# Patient Record
Sex: Female | Born: 1935 | Race: White | Hispanic: No | Smoking: Never smoker
Health system: Southern US, Community
[De-identification: ages and names within clinical notes are randomized; demographics above are authoritative.]

## PROBLEM LIST (undated history)

## (undated) DIAGNOSIS — F32A Depression, unspecified: Secondary | ICD-10-CM

## (undated) DIAGNOSIS — K219 Gastro-esophageal reflux disease without esophagitis: Secondary | ICD-10-CM

## (undated) DIAGNOSIS — I82409 Acute embolism and thrombosis of unspecified deep veins of unspecified lower extremity: Secondary | ICD-10-CM

## (undated) DIAGNOSIS — N390 Urinary tract infection, site not specified: Secondary | ICD-10-CM

## (undated) DIAGNOSIS — F329 Major depressive disorder, single episode, unspecified: Secondary | ICD-10-CM

## (undated) DIAGNOSIS — Z9289 Personal history of other medical treatment: Secondary | ICD-10-CM

## (undated) DIAGNOSIS — K5792 Diverticulitis of intestine, part unspecified, without perforation or abscess without bleeding: Secondary | ICD-10-CM

## (undated) DIAGNOSIS — M1990 Unspecified osteoarthritis, unspecified site: Secondary | ICD-10-CM

## (undated) DIAGNOSIS — E119 Type 2 diabetes mellitus without complications: Secondary | ICD-10-CM

## (undated) DIAGNOSIS — D649 Anemia, unspecified: Secondary | ICD-10-CM

## (undated) HISTORY — PX: TONSILECTOMY, ADENOIDECTOMY, BILATERAL MYRINGOTOMY AND TUBES: SHX2538

## (undated) HISTORY — PX: ANKLE FRACTURE SURGERY: SHX122

## (undated) HISTORY — PX: FRACTURE SURGERY: SHX138

## (undated) HISTORY — PX: CHOLECYSTECTOMY OPEN: SUR202

## (undated) HISTORY — PX: APPENDECTOMY: SHX54

## (undated) HISTORY — PX: TONSILLECTOMY: SUR1361

## (undated) HISTORY — PX: CYST EXCISION: SHX5701

## (undated) HISTORY — PX: CATARACT EXTRACTION W/ INTRAOCULAR LENS  IMPLANT, BILATERAL: SHX1307

---

## 1997-11-20 ENCOUNTER — Other Ambulatory Visit: Admission: RE | Admit: 1997-11-20 | Discharge: 1997-11-20 | Payer: Self-pay | Admitting: Internal Medicine

## 1998-02-17 ENCOUNTER — Emergency Department (HOSPITAL_COMMUNITY): Admission: EM | Admit: 1998-02-17 | Discharge: 1998-02-17 | Payer: Self-pay | Admitting: Emergency Medicine

## 1998-02-17 ENCOUNTER — Encounter: Payer: Self-pay | Admitting: Emergency Medicine

## 1998-11-26 ENCOUNTER — Other Ambulatory Visit: Admission: RE | Admit: 1998-11-26 | Discharge: 1998-11-26 | Payer: Self-pay | Admitting: Internal Medicine

## 1999-12-23 ENCOUNTER — Other Ambulatory Visit: Admission: RE | Admit: 1999-12-23 | Discharge: 1999-12-23 | Payer: Self-pay | Admitting: Internal Medicine

## 2000-09-16 ENCOUNTER — Ambulatory Visit (HOSPITAL_COMMUNITY): Admission: RE | Admit: 2000-09-16 | Discharge: 2000-09-17 | Payer: Self-pay | Admitting: Surgery

## 2000-09-16 ENCOUNTER — Encounter (INDEPENDENT_AMBULATORY_CARE_PROVIDER_SITE_OTHER): Payer: Self-pay | Admitting: *Deleted

## 2000-11-10 ENCOUNTER — Ambulatory Visit (HOSPITAL_COMMUNITY): Admission: RE | Admit: 2000-11-10 | Discharge: 2000-11-10 | Payer: Self-pay | Admitting: Specialist

## 2001-01-24 ENCOUNTER — Ambulatory Visit (HOSPITAL_COMMUNITY): Admission: RE | Admit: 2001-01-24 | Discharge: 2001-01-24 | Payer: Self-pay | Admitting: Specialist

## 2001-05-12 ENCOUNTER — Encounter (INDEPENDENT_AMBULATORY_CARE_PROVIDER_SITE_OTHER): Payer: Self-pay | Admitting: Specialist

## 2001-05-12 ENCOUNTER — Ambulatory Visit (HOSPITAL_COMMUNITY): Admission: RE | Admit: 2001-05-12 | Discharge: 2001-05-12 | Payer: Self-pay | Admitting: Gastroenterology

## 2008-03-22 ENCOUNTER — Ambulatory Visit (HOSPITAL_COMMUNITY): Admission: RE | Admit: 2008-03-22 | Discharge: 2008-03-22 | Payer: Self-pay | Admitting: Internal Medicine

## 2008-07-18 ENCOUNTER — Ambulatory Visit (HOSPITAL_BASED_OUTPATIENT_CLINIC_OR_DEPARTMENT_OTHER): Admission: RE | Admit: 2008-07-18 | Discharge: 2008-07-18 | Payer: Self-pay | Admitting: Internal Medicine

## 2008-07-18 ENCOUNTER — Ambulatory Visit: Payer: Self-pay | Admitting: Diagnostic Radiology

## 2009-07-23 ENCOUNTER — Ambulatory Visit (HOSPITAL_BASED_OUTPATIENT_CLINIC_OR_DEPARTMENT_OTHER): Admission: RE | Admit: 2009-07-23 | Discharge: 2009-07-23 | Payer: Self-pay | Admitting: Internal Medicine

## 2009-07-23 ENCOUNTER — Ambulatory Visit: Payer: Self-pay | Admitting: Diagnostic Radiology

## 2010-05-23 NOTE — Procedures (Signed)
[facility]  Patient:    [name], [name] Visit Number: [id number] MRN: [id number]          Service Type: END Location: ENDO Attending Physician:  Nelda Marseille Dictated by:   Petra Kuba, M.D. Proc. Date: 05/12/01 Admit Date:  05/12/2001   CC:         Richard A. Jacky Kindle, M.D.   Procedure Report  PROCEDURE:  Colonoscopy with polypectomy.  INDICATION:  Anemia in a patient due for colonic screening. Consent was signed after risk, benefits, methods, and options were thoroughly discussed in the office.  MEDICINES USED:  Demerol 50 mg, Versed 6 mg.  DESCRIPTION OF PROCEDURE:  Rectal inspection was pertinent for external hemorrhoids. Digital exam was negative. Video pediatric adjustable colonoscope was inserted and with lots of difficulty due to a diverticula-filled sigmoid with some tortuosity, we were able to be advanced through this area and then fairly easily advanced to the cecum. This did not require any abdominal pressure or any position changes. On insertion, left greater than right diverticula were seen, possibly a small polyp was seen in the sigmoid, amid diverticula but no other abnormalities. The cecum was identified by the appendiceal orifice and the ileocecal valve. _______ the scope was inserted shortways in the terminal ileum, which was normal. Photo documentation was obtained and the scope was slowly withdrawn. The prep was adequate. There was some liquid stool that required washing and suctioning in the cecal pole, a questionable small sessile polyp was seen and was cold biopsied x 2. The scope was further withdrawn and at the proximal level of the hepatic flexure, a small polyp was seen, snare electrocautery applied, and the polyp was suctioned through the scope and collected in the trap. Two additional descending and sigmoid polyps were seen and were each hot biopsied x one or two on withdrawal. We are not sure if the last one  in the sigmoid was the exact same as the one seen on insertion, but no other polyps were seen. On slow withdrawal back to the rectum, other than the left greater than right diverticula mentioned above, no additional findings were seen. Once back in the rectum, the scope was then retroflexed; pertinent for some internal hemorrhoids. The scope was straightened and readvanced shortways up the left side of the colon. Air was suctioned and scope removed. The patient tolerated the procedure well. There was no obvious immediate complication.  ENDOSCOPIC DIAGNOSES: 1. Internal/external hemorrhoids. 2. Significant left greater than right diverticula and tortuosity. 3. Three tiny to small polyps were in the transverse/hepatic flexures, snared;    two others in the descending, hot biopsied. Questionably, one other small    one seen on insertion in the sigmoid and not seen on withdrawal. Hard    to know if that was the same one that hot biopsied. 4. Small sessile questionable polyp in the cecum, cold biopsied. 5. Otherwise within normal limits to the terminal ileum.  PLAN:  Await pathology to determine future colonic screening. Otherwise, continue workup with an EGD and see that dictation for further workup and plans. Dictated by:   Petra Kuba, M.D. Attending Physician:  Nelda Marseille DD:  05/12/01 TD:  05/14/01 Job: [id number]

## 2010-05-23 NOTE — Op Note (Signed)
[facility]  Patient:    [name], DUTAN Visit Number: [id number] MRN: [id number]          Service Type: DSU Location: RCRM 2550 04 Attending Physician:  Bonnetta Barry Dictated by:   Velora Heckler, M.D. Proc. Date: 09/16/00 Admit Date:  09/16/2000   CC:         Richard A. Jacky Kindle, M.D.   Operative Report  PREOPERATIVE DIAGNOSIS:  Symptomatic cholelithiasis, chronic cholecystitis.  POSTOPERATIVE DIAGNOSIS:  Symptomatic cholelithiasis, chronic cholecystitis.  OPERATION:  Laparoscopic cholecystectomy.  SURGEON:  Velora Heckler, M.D.  ASSISTANT:  Sandria Bales. Ezzard Standing, M.D.  ANESTHESIA:  General  ESTIMATED BLOOD LOSS:  Minimal  PREPARATION:  Betadine.  COMPLICATIONS:  None  INDICATIONS:  The patient is a 77 year old white female who presents at the request of Dr. Geoffry Paradise with intermittent abdominal pain.  The patient had undergone ultrasound at [facility] demonstrating multiple gallstones.  Liver function tests were normal.  The patient now comes to surgery for cholecystectomy.  DESCRIPTION OF PROCEDURE:  Procedure is done in OR #17 at the [facility].  The patient is brought to the operating room and placed in a supine position on the operating room table.  Following administration of general anesthesia, the patient was prepped and draped in the usual strict aseptic fashion. After ascertaining that an adequate level of anesthesia had been obtained, an infraumbilical incision was made with a #15 blade. Dissection was carried down through the subcutaneous tissues.  Fascia was incised in the midline and the peritoneal cavity was entered cautiously.  An 0 Vicryl pursestring suture was placed in the fascia.  A Hasson cannula was introduced under direct vision and secured with a pursestring suture.  Abdomen was insufflated with carbon dioxide.  Laparoscopic was introduced under direct vision and the  abdomen explored.  Operative ports were placed along the right costal margin in the midline, midclavicular line, and the anterior axillary line.  The fundus of the gallbladder was grasped and retracted cephalad. There are numerous omental adhesions to the undersurface of the gallbladder and liver.  These are taken down with blunt dissection and hemostasis obtained with the electrocautery.  Dissection is begun at the neck of the gallbladder. The common bile is identified.  The cystic duct is dissected out along its length, triply clipped, and divided.  Cystic artery is dissected out along its length, doubly clipped, and divided.  Small lymphatic vessels to the undersurface of the gallbladder are divided between ligaclips. The gallbladder is excised from the gallbladder bed using a spatula electrocautery for hemostasis.  Gallbladder is extracted through the umbilical port without difficulty.  It contains multiple gallstones.  O Vicryl pursestring suture is tied securely.  Right upper quadrant is copiously irrigated with warm saline and good hemostasis is noted.  Fluid is evacuated.  Ports are removed under direct vision and pneumoperitoneum released.  Operative sites are anesthetized with local anesthetic. All four wounds are closed with interrupted 4-0 Vicryl subcuticular sutures.  Wounds are washed and dried and Benzoin Steri-Strips are applied.  Sterile gauze dressings are applied.  The patient is awakened from anesthesia and brought to the recovery room in stable condition.  The patient tolerated the procedure well. Dictated by:   Velora Heckler, M.D. Attending Physician:  Bonnetta Barry DD:  09/16/00 TD:  09/16/00 Job: [id number]

## 2010-05-23 NOTE — Procedures (Signed)
[facility]  Patient:    [name], [name] Visit Number: [id number] MRN: [id number]          Service Type: END Location: ENDO Attending Physician:  Nelda Marseille Dictated by:   Petra Kuba, M.D. Proc. Date: 05/12/01 Admit Date:  05/12/2001   CC:         Richard A. Jacky Kindle, M.D.   Procedure Report  PROCEDURE:  Esophagogastroduodenoscopy with biopsy.  INDICATIONS FOR PROCEDURE:  Anemia, nondiagnostic colonoscopy.  Consent was signed prior to any premeds after risks, benefits, methods, and options were thoroughly discussed in the office.  MEDICINES USED:  Demerol 10, Versed 1 since this procedure followed the endoscopy.  DESCRIPTION OF PROCEDURE:  The video endoscope was inserted by direct vision. Her proximal mid esophagus was normal. Beginning at about 30 cm was a large hiatal hernia. There was a widely patent small fibrous ring just above it. The scope passed into the stomach and easily advanced through a normal antrum, normal pylorus into a normal duodenal bulb and around the C loop to a normal second portion of the duodenum. A few distal duodenal biopsies were obtained. The scope was withdrawn back to the bulb and a good look there ruled out ulcers in that location. The scope was withdrawn back to the stomach and retroflexed. The angularis and fundus were normal. High in the cardia, the hiatal hernia was confirmed. The lesser and greater curve were normal on retroflexed visualization. Straight visualization of the stomach was normal except for the hiatal hernia. Air was suctioned, scope withdrawn. Again a look at the esophagus was normal. The scope was removed. The patient tolerated the procedure well. There was no obvious or immediate complication.  ENDOSCOPIC DIAGNOSIS:  1. Large hiatal hernia with a widely patent thin fibrous ring.  2. Otherwise within normal limits esophagogastroduodenoscopy status post     duodenal biopsy to rule  out malabsorption.  PLAN:  Will add iron, follow-up p.r.n. or in six weeks to recheck guaiac, CBC and decide any further workup and plans like possibly a one time small bowel follow-through. Dictated by:   Petra Kuba, M.D. Attending Physician:  Nelda Marseille DD:  05/12/01 TD:  05/14/01 Job: [id number]

## 2010-06-23 ENCOUNTER — Other Ambulatory Visit (HOSPITAL_BASED_OUTPATIENT_CLINIC_OR_DEPARTMENT_OTHER): Payer: Self-pay | Admitting: Internal Medicine

## 2010-06-23 DIAGNOSIS — Z1231 Encounter for screening mammogram for malignant neoplasm of breast: Secondary | ICD-10-CM

## 2010-07-29 ENCOUNTER — Ambulatory Visit (HOSPITAL_BASED_OUTPATIENT_CLINIC_OR_DEPARTMENT_OTHER)
Admission: RE | Admit: 2010-07-29 | Discharge: 2010-07-29 | Disposition: A | Discharge status: Home / Self Care | Payer: Medicare Other | Source: Ambulatory Visit | Attending: Internal Medicine | Admitting: Internal Medicine

## 2010-07-29 DIAGNOSIS — Z1231 Encounter for screening mammogram for malignant neoplasm of breast: Secondary | ICD-10-CM | POA: Insufficient documentation

## 2011-01-16 ENCOUNTER — Other Ambulatory Visit (HOSPITAL_COMMUNITY): Payer: Self-pay | Admitting: *Deleted

## 2011-01-20 ENCOUNTER — Inpatient Hospital Stay (HOSPITAL_COMMUNITY): Admission: RE | Admit: 2011-01-20 | Payer: Medicare Other | Source: Ambulatory Visit

## 2011-01-27 ENCOUNTER — Encounter (HOSPITAL_COMMUNITY): Payer: Self-pay

## 2011-01-27 ENCOUNTER — Encounter (HOSPITAL_COMMUNITY)
Admission: RE | Admit: 2011-01-27 | Discharge: 2011-01-27 | Disposition: A | Discharge status: Home / Self Care | Payer: Medicare Other | Source: Ambulatory Visit | Attending: Internal Medicine | Admitting: Internal Medicine

## 2011-01-27 DIAGNOSIS — D649 Anemia, unspecified: Secondary | ICD-10-CM | POA: Insufficient documentation

## 2011-01-27 HISTORY — DX: Anemia, unspecified: D64.9

## 2011-01-27 HISTORY — DX: Gastro-esophageal reflux disease without esophagitis: K21.9

## 2011-01-27 MED ORDER — SODIUM CHLORIDE 0.9 % IV SOLN
INTRAVENOUS | Status: DC
  Administered 2011-01-27: 15:00:00 via INTRAVENOUS

## 2011-01-27 MED ORDER — FERUMOXYTOL INJECTION 510 MG/17 ML
510.0000 mg | INTRAVENOUS | Status: DC
  Administered 2011-01-27: 510 mg via INTRAVENOUS
  Filled 2011-01-27: qty 17

## 2011-02-04 ENCOUNTER — Encounter (HOSPITAL_COMMUNITY): Payer: Self-pay

## 2011-02-04 ENCOUNTER — Encounter (HOSPITAL_COMMUNITY)
Admission: RE | Admit: 2011-02-04 | Discharge: 2011-02-04 | Disposition: A | Discharge status: Home / Self Care | Payer: Medicare Other | Source: Ambulatory Visit | Attending: Internal Medicine | Admitting: Internal Medicine

## 2011-02-04 MED ORDER — SODIUM CHLORIDE 0.9 % IV SOLN
INTRAVENOUS | Status: AC
  Administered 2011-02-04: 15:00:00 via INTRAVENOUS

## 2011-02-04 MED ORDER — FERUMOXYTOL INJECTION 510 MG/17 ML
510.0000 mg | INTRAVENOUS | Status: AC
  Administered 2011-02-04: 510 mg via INTRAVENOUS
  Filled 2011-02-04: qty 17

## 2011-06-25 ENCOUNTER — Other Ambulatory Visit (HOSPITAL_BASED_OUTPATIENT_CLINIC_OR_DEPARTMENT_OTHER): Payer: Self-pay | Admitting: Internal Medicine

## 2011-06-25 DIAGNOSIS — Z1231 Encounter for screening mammogram for malignant neoplasm of breast: Secondary | ICD-10-CM

## 2011-09-02 ENCOUNTER — Ambulatory Visit (HOSPITAL_BASED_OUTPATIENT_CLINIC_OR_DEPARTMENT_OTHER): Payer: Medicare Other

## 2011-09-03 ENCOUNTER — Other Ambulatory Visit: Payer: Self-pay | Admitting: Internal Medicine

## 2011-09-03 ENCOUNTER — Ambulatory Visit (HOSPITAL_BASED_OUTPATIENT_CLINIC_OR_DEPARTMENT_OTHER)
Admission: RE | Admit: 2011-09-03 | Discharge: 2011-09-03 | Disposition: A | Discharge status: Home / Self Care | Payer: Medicare Other | Source: Ambulatory Visit | Attending: Internal Medicine | Admitting: Internal Medicine

## 2011-09-03 DIAGNOSIS — Z1231 Encounter for screening mammogram for malignant neoplasm of breast: Secondary | ICD-10-CM

## 2012-07-29 ENCOUNTER — Other Ambulatory Visit (HOSPITAL_BASED_OUTPATIENT_CLINIC_OR_DEPARTMENT_OTHER): Payer: Self-pay | Admitting: Internal Medicine

## 2012-07-29 DIAGNOSIS — Z1231 Encounter for screening mammogram for malignant neoplasm of breast: Secondary | ICD-10-CM

## 2012-09-07 ENCOUNTER — Ambulatory Visit (HOSPITAL_BASED_OUTPATIENT_CLINIC_OR_DEPARTMENT_OTHER)
Admission: RE | Admit: 2012-09-07 | Discharge: 2012-09-07 | Disposition: A | Discharge status: Home / Self Care | Payer: Medicare Other | Source: Ambulatory Visit | Attending: Internal Medicine | Admitting: Internal Medicine

## 2012-09-07 DIAGNOSIS — Z1231 Encounter for screening mammogram for malignant neoplasm of breast: Secondary | ICD-10-CM | POA: Insufficient documentation

## 2012-12-19 ENCOUNTER — Inpatient Hospital Stay (HOSPITAL_COMMUNITY): Admission: RE | Admit: 2012-12-19 | Payer: Medicare Other | Source: Ambulatory Visit

## 2012-12-19 ENCOUNTER — Other Ambulatory Visit (HOSPITAL_COMMUNITY): Payer: Self-pay | Admitting: Internal Medicine

## 2013-01-13 ENCOUNTER — Encounter (HOSPITAL_COMMUNITY)
Admission: RE | Admit: 2013-01-13 | Discharge: 2013-01-13 | Disposition: A | Discharge status: Home / Self Care | Payer: Medicare Other | Source: Ambulatory Visit | Attending: Internal Medicine | Admitting: Internal Medicine

## 2013-01-13 ENCOUNTER — Encounter (HOSPITAL_COMMUNITY): Payer: Self-pay

## 2013-01-13 DIAGNOSIS — D649 Anemia, unspecified: Secondary | ICD-10-CM | POA: Insufficient documentation

## 2013-01-13 MED ORDER — SODIUM CHLORIDE 0.9 % IV SOLN
Freq: Once | INTRAVENOUS | Status: AC
  Administered 2013-01-13: 15:00:00 via INTRAVENOUS

## 2013-01-13 MED ORDER — SODIUM CHLORIDE 0.9 % IV SOLN
1020.0000 mg | Freq: Once | INTRAVENOUS | Status: AC
  Administered 2013-01-13: 1020 mg via INTRAVENOUS
  Filled 2013-01-13: qty 34

## 2013-01-13 NOTE — Discharge Instructions (Signed)

## 2013-08-17 ENCOUNTER — Other Ambulatory Visit (HOSPITAL_BASED_OUTPATIENT_CLINIC_OR_DEPARTMENT_OTHER): Payer: Self-pay | Admitting: Internal Medicine

## 2013-08-17 DIAGNOSIS — Z1231 Encounter for screening mammogram for malignant neoplasm of breast: Secondary | ICD-10-CM

## 2013-10-06 ENCOUNTER — Ambulatory Visit (HOSPITAL_BASED_OUTPATIENT_CLINIC_OR_DEPARTMENT_OTHER)
Admission: RE | Admit: 2013-10-06 | Discharge: 2013-10-06 | Disposition: A | Discharge status: Home / Self Care | Payer: Medicare Other | Source: Ambulatory Visit | Attending: Internal Medicine | Admitting: Internal Medicine

## 2013-10-06 DIAGNOSIS — Z1231 Encounter for screening mammogram for malignant neoplasm of breast: Secondary | ICD-10-CM | POA: Diagnosis present

## 2014-09-12 ENCOUNTER — Other Ambulatory Visit (HOSPITAL_BASED_OUTPATIENT_CLINIC_OR_DEPARTMENT_OTHER): Payer: Self-pay | Admitting: Internal Medicine

## 2014-09-12 DIAGNOSIS — Z1231 Encounter for screening mammogram for malignant neoplasm of breast: Secondary | ICD-10-CM

## 2014-10-09 ENCOUNTER — Ambulatory Visit (HOSPITAL_BASED_OUTPATIENT_CLINIC_OR_DEPARTMENT_OTHER): Payer: Federal, State, Local not specified - PPO

## 2014-10-12 ENCOUNTER — Ambulatory Visit (HOSPITAL_BASED_OUTPATIENT_CLINIC_OR_DEPARTMENT_OTHER)
Admission: RE | Admit: 2014-10-12 | Discharge: 2014-10-12 | Disposition: A | Discharge status: Home / Self Care | Payer: Medicare Other | Source: Ambulatory Visit | Attending: Internal Medicine | Admitting: Internal Medicine

## 2014-10-12 DIAGNOSIS — Z1231 Encounter for screening mammogram for malignant neoplasm of breast: Secondary | ICD-10-CM | POA: Diagnosis not present

## 2014-12-19 ENCOUNTER — Other Ambulatory Visit (HOSPITAL_COMMUNITY): Payer: Self-pay | Admitting: *Deleted

## 2014-12-20 ENCOUNTER — Ambulatory Visit (HOSPITAL_COMMUNITY)
Admission: RE | Admit: 2014-12-20 | Discharge: 2014-12-20 | Disposition: A | Discharge status: Home / Self Care | Payer: Medicare Other | Source: Ambulatory Visit | Attending: Internal Medicine | Admitting: Internal Medicine

## 2014-12-20 DIAGNOSIS — D649 Anemia, unspecified: Secondary | ICD-10-CM | POA: Diagnosis present

## 2014-12-20 MED ORDER — FERUMOXYTOL INJECTION 510 MG/17 ML
510.0000 mg | INTRAVENOUS | Status: DC
  Administered 2014-12-20: 510 mg via INTRAVENOUS
  Filled 2014-12-20: qty 17

## 2014-12-25 ENCOUNTER — Other Ambulatory Visit (HOSPITAL_COMMUNITY): Payer: Self-pay | Admitting: *Deleted

## 2014-12-26 ENCOUNTER — Ambulatory Visit (HOSPITAL_COMMUNITY)
Admission: RE | Admit: 2014-12-26 | Discharge: 2014-12-26 | Disposition: A | Discharge status: Home / Self Care | Payer: Medicare Other | Source: Ambulatory Visit | Attending: Internal Medicine | Admitting: Internal Medicine

## 2014-12-26 DIAGNOSIS — D649 Anemia, unspecified: Secondary | ICD-10-CM | POA: Diagnosis present

## 2014-12-26 MED ORDER — SODIUM CHLORIDE 0.9 % IV SOLN
510.0000 mg | INTRAVENOUS | Status: DC
  Administered 2014-12-26: 510 mg via INTRAVENOUS
  Filled 2014-12-26: qty 17

## 2014-12-27 DIAGNOSIS — D649 Anemia, unspecified: Secondary | ICD-10-CM | POA: Diagnosis not present

## 2015-05-21 ENCOUNTER — Emergency Department (HOSPITAL_COMMUNITY): Payer: Medicare Other

## 2015-05-21 ENCOUNTER — Encounter (HOSPITAL_COMMUNITY): Payer: Self-pay | Admitting: *Deleted

## 2015-05-21 ENCOUNTER — Inpatient Hospital Stay (HOSPITAL_COMMUNITY)
Admission: EM | Admit: 2015-05-21 | Discharge: 2015-05-24 | DRG: 872 | Disposition: A | Discharge status: Home / Self Care | Payer: Medicare Other | Attending: Internal Medicine | Admitting: Internal Medicine

## 2015-05-21 DIAGNOSIS — A419 Sepsis, unspecified organism: Secondary | ICD-10-CM

## 2015-05-21 DIAGNOSIS — A4151 Sepsis due to Escherichia coli [E. coli]: Principal | ICD-10-CM | POA: Diagnosis present

## 2015-05-21 DIAGNOSIS — R0902 Hypoxemia: Secondary | ICD-10-CM | POA: Diagnosis present

## 2015-05-21 DIAGNOSIS — R0689 Other abnormalities of breathing: Secondary | ICD-10-CM | POA: Diagnosis present

## 2015-05-21 DIAGNOSIS — Z7984 Long term (current) use of oral hypoglycemic drugs: Secondary | ICD-10-CM | POA: Diagnosis not present

## 2015-05-21 DIAGNOSIS — Z7982 Long term (current) use of aspirin: Secondary | ICD-10-CM

## 2015-05-21 DIAGNOSIS — K219 Gastro-esophageal reflux disease without esophagitis: Secondary | ICD-10-CM | POA: Diagnosis present

## 2015-05-21 DIAGNOSIS — Z79899 Other long term (current) drug therapy: Secondary | ICD-10-CM

## 2015-05-21 DIAGNOSIS — I10 Essential (primary) hypertension: Secondary | ICD-10-CM | POA: Diagnosis present

## 2015-05-21 DIAGNOSIS — E876 Hypokalemia: Secondary | ICD-10-CM | POA: Diagnosis present

## 2015-05-21 DIAGNOSIS — E1165 Type 2 diabetes mellitus with hyperglycemia: Secondary | ICD-10-CM | POA: Diagnosis present

## 2015-05-21 DIAGNOSIS — R6510 Systemic inflammatory response syndrome (SIRS) of non-infectious origin without acute organ dysfunction: Secondary | ICD-10-CM | POA: Diagnosis present

## 2015-05-21 DIAGNOSIS — D509 Iron deficiency anemia, unspecified: Secondary | ICD-10-CM | POA: Diagnosis present

## 2015-05-21 DIAGNOSIS — N390 Urinary tract infection, site not specified: Secondary | ICD-10-CM | POA: Diagnosis present

## 2015-05-21 DIAGNOSIS — E118 Type 2 diabetes mellitus with unspecified complications: Secondary | ICD-10-CM | POA: Insufficient documentation

## 2015-05-21 DIAGNOSIS — R519 Headache, unspecified: Secondary | ICD-10-CM | POA: Diagnosis present

## 2015-05-21 DIAGNOSIS — IMO0002 Reserved for concepts with insufficient information to code with codable children: Secondary | ICD-10-CM | POA: Diagnosis present

## 2015-05-21 DIAGNOSIS — R51 Headache: Secondary | ICD-10-CM

## 2015-05-21 HISTORY — DX: Depression, unspecified: F32.A

## 2015-05-21 HISTORY — DX: Major depressive disorder, single episode, unspecified: F32.9

## 2015-05-21 HISTORY — DX: Unspecified osteoarthritis, unspecified site: M19.90

## 2015-05-21 HISTORY — DX: Urinary tract infection, site not specified: N39.0

## 2015-05-21 HISTORY — DX: Acute embolism and thrombosis of unspecified deep veins of unspecified lower extremity: I82.409

## 2015-05-21 HISTORY — DX: Type 2 diabetes mellitus without complications: E11.9

## 2015-05-21 HISTORY — DX: Personal history of other medical treatment: Z92.89

## 2015-05-21 HISTORY — DX: Diverticulitis of intestine, part unspecified, without perforation or abscess without bleeding: K57.92

## 2015-05-21 LAB — COMPREHENSIVE METABOLIC PANEL
ALBUMIN: 3.2 g/dL — AB (ref 3.5–5.0)
ALT: 9 U/L — AB (ref 14–54)
AST: 19 U/L (ref 15–41)
Alkaline Phosphatase: 70 U/L (ref 38–126)
Anion gap: 16 — ABNORMAL HIGH (ref 5–15)
BUN: 14 mg/dL (ref 6–20)
CHLORIDE: 101 mmol/L (ref 101–111)
CO2: 19 mmol/L — AB (ref 22–32)
CREATININE: 0.92 mg/dL (ref 0.44–1.00)
Calcium: 9.5 mg/dL (ref 8.9–10.3)
GFR calc Af Amer: >60 mL/min (ref >60)
GFR, EST NON AFRICAN AMERICAN: 54 mL/min — AB (ref >60)
GLUCOSE: 296 mg/dL — AB (ref 65–99)
Potassium: 3.9 mmol/L (ref 3.5–5.1)
SODIUM: 136 mmol/L (ref 135–145)
Total Bilirubin: 1 mg/dL (ref 0.3–1.2)
Total Protein: 5.6 g/dL — ABNORMAL LOW (ref 6.5–8.1)

## 2015-05-21 LAB — CBC WITH DIFFERENTIAL/PLATELET
Basophils Absolute: 0 10*3/uL (ref 0.0–0.1)
Basophils Relative: 0 %
EOS ABS: 0 10*3/uL (ref 0.0–0.7)
EOS PCT: 0 %
HCT: 37.7 % (ref 36.0–46.0)
Hemoglobin: 11.9 g/dL — ABNORMAL LOW (ref 12.0–15.0)
LYMPHS ABS: 0.3 10*3/uL — AB (ref 0.7–4.0)
Lymphocytes Relative: 3 %
MCH: 25.5 pg — AB (ref 26.0–34.0)
MCHC: 31.6 g/dL (ref 30.0–36.0)
MCV: 80.7 fL (ref 78.0–100.0)
MONO ABS: 0.3 10*3/uL (ref 0.1–1.0)
MONOS PCT: 3 %
Neutro Abs: 9.2 10*3/uL — ABNORMAL HIGH (ref 1.7–7.7)
Neutrophils Relative %: 94 %
PLATELETS: 209 10*3/uL (ref 150–400)
RBC: 4.67 MIL/uL (ref 3.87–5.11)
RDW: 15.3 % (ref 11.5–15.5)
WBC: 9.9 10*3/uL (ref 4.0–10.5)

## 2015-05-21 LAB — URINALYSIS, ROUTINE W REFLEX MICROSCOPIC
BILIRUBIN URINE: NEGATIVE
KETONES UR: 15 mg/dL — AB
NITRITE: POSITIVE — AB
PH: 6 (ref 5.0–8.0)
PROTEIN: 100 mg/dL — AB
Specific Gravity, Urine: 1.016 (ref 1.005–1.030)

## 2015-05-21 LAB — URINE MICROSCOPIC-ADD ON

## 2015-05-21 LAB — TYPE AND SCREEN
ABO/RH(D): A POS
Antibody Screen: NEGATIVE

## 2015-05-21 LAB — I-STAT CG4 LACTIC ACID, ED
LACTIC ACID, VENOUS: 1.79 mmol/L (ref 0.5–2.0)
Lactic Acid, Venous: 2.39 mmol/L (ref 0.5–2.0)

## 2015-05-21 LAB — ABO/RH: ABO/RH(D): A POS

## 2015-05-21 LAB — CBG MONITORING, ED
Glucose-Capillary: 240 mg/dL — ABNORMAL HIGH (ref 65–99)
Glucose-Capillary: 287 mg/dL — ABNORMAL HIGH (ref 65–99)

## 2015-05-21 LAB — GLUCOSE, CAPILLARY
GLUCOSE-CAPILLARY: 207 mg/dL — AB (ref 65–99)
GLUCOSE-CAPILLARY: 222 mg/dL — AB (ref 65–99)

## 2015-05-21 LAB — MAGNESIUM: Magnesium: 1.3 mg/dL — ABNORMAL LOW (ref 1.7–2.4)

## 2015-05-21 MED ORDER — MAGNESIUM SULFATE 2 GM/50ML IV SOLN
2.0000 g | Freq: Once | INTRAVENOUS | Status: AC
  Administered 2015-05-21: 2 g via INTRAVENOUS
  Filled 2015-05-21: qty 50

## 2015-05-21 MED ORDER — PIPERACILLIN-TAZOBACTAM 3.375 G IVPB 30 MIN
3.3750 g | Freq: Once | INTRAVENOUS | Status: AC
  Administered 2015-05-21: 3.375 g via INTRAVENOUS
  Filled 2015-05-21: qty 50

## 2015-05-21 MED ORDER — GLIMEPIRIDE 2 MG PO TABS
2.0000 mg | ORAL_TABLET | ORAL | Status: DC
  Administered 2015-05-21: 2 mg via ORAL
  Filled 2015-05-21: qty 0.5
  Filled 2015-05-21: qty 1

## 2015-05-21 MED ORDER — ENOXAPARIN SODIUM 40 MG/0.4ML ~~LOC~~ SOLN
40.0000 mg | SUBCUTANEOUS | Status: DC
  Administered 2015-05-21 – 2015-05-24 (×4): 40 mg via SUBCUTANEOUS
  Filled 2015-05-21 (×4): qty 0.4

## 2015-05-21 MED ORDER — ACETAMINOPHEN 325 MG PO TABS
650.0000 mg | ORAL_TABLET | Freq: Four times a day (QID) | ORAL | Status: DC | PRN
  Administered 2015-05-21 – 2015-05-22 (×2): 650 mg via ORAL
  Filled 2015-05-21 (×2): qty 2

## 2015-05-21 MED ORDER — SODIUM CHLORIDE 0.9 % IV BOLUS (SEPSIS)
1000.0000 mL | Freq: Once | INTRAVENOUS | Status: AC
  Administered 2015-05-21: 1000 mL via INTRAVENOUS

## 2015-05-21 MED ORDER — AMLODIPINE BESYLATE 5 MG PO TABS
5.0000 mg | ORAL_TABLET | Freq: Every day | ORAL | Status: DC
  Administered 2015-05-21 – 2015-05-24 (×4): 5 mg via ORAL
  Filled 2015-05-21 (×4): qty 1

## 2015-05-21 MED ORDER — VANCOMYCIN HCL 10 G IV SOLR
1250.0000 mg | Freq: Once | INTRAVENOUS | Status: AC
  Administered 2015-05-21: 1250 mg via INTRAVENOUS
  Filled 2015-05-21: qty 1250

## 2015-05-21 MED ORDER — DEXTROSE 5 % IV SOLN
2.0000 g | INTRAVENOUS | Status: DC
  Administered 2015-05-21 – 2015-05-24 (×4): 2 g via INTRAVENOUS
  Filled 2015-05-21 (×4): qty 2

## 2015-05-21 MED ORDER — PANTOPRAZOLE SODIUM 40 MG PO TBEC: 40.0000 mg | DELAYED_RELEASE_TABLET | Freq: Every day | ORAL | Status: DC

## 2015-05-21 MED ORDER — POLYSACCHARIDE IRON COMPLEX 150 MG PO CAPS
150.0000 mg | ORAL_CAPSULE | Freq: Two times a day (BID) | ORAL | Status: DC
  Administered 2015-05-21 – 2015-05-24 (×7): 150 mg via ORAL
  Filled 2015-05-21 (×7): qty 1

## 2015-05-21 MED ORDER — ASPIRIN EC 325 MG PO TBEC
325.0000 mg | DELAYED_RELEASE_TABLET | Freq: Every day | ORAL | Status: DC
  Administered 2015-05-21 – 2015-05-24 (×4): 325 mg via ORAL
  Filled 2015-05-21 (×4): qty 1

## 2015-05-21 MED ORDER — PANTOPRAZOLE SODIUM 40 MG PO TBEC
40.0000 mg | DELAYED_RELEASE_TABLET | Freq: Every day | ORAL | Status: DC
  Administered 2015-05-21 – 2015-05-24 (×4): 40 mg via ORAL
  Filled 2015-05-21 (×4): qty 1

## 2015-05-21 MED ORDER — INSULIN ASPART 100 UNIT/ML ~~LOC~~ SOLN
0.0000 [IU] | Freq: Every day | SUBCUTANEOUS | Status: DC
  Administered 2015-05-21 – 2015-05-23 (×3): 2 [IU] via SUBCUTANEOUS

## 2015-05-21 MED ORDER — ACETAMINOPHEN 500 MG PO TABS
1000.0000 mg | ORAL_TABLET | Freq: Once | ORAL | Status: AC
  Administered 2015-05-21: 1000 mg via ORAL
  Filled 2015-05-21: qty 2

## 2015-05-21 MED ORDER — KETOROLAC TROMETHAMINE 15 MG/ML IJ SOLN
15.0000 mg | Freq: Once | INTRAMUSCULAR | Status: AC
  Administered 2015-05-21: 15 mg via INTRAVENOUS
  Filled 2015-05-21: qty 1

## 2015-05-21 MED ORDER — OXYCODONE HCL 5 MG PO TABS
5.0000 mg | ORAL_TABLET | ORAL | Status: DC | PRN
  Administered 2015-05-22 – 2015-05-24 (×11): 5 mg via ORAL
  Filled 2015-05-21 (×12): qty 1

## 2015-05-21 MED ORDER — LATANOPROST 0.005 % OP SOLN
1.0000 [drp] | Freq: Every day | OPHTHALMIC | Status: DC
  Administered 2015-05-21 – 2015-05-23 (×3): 1 [drp] via OPHTHALMIC
  Filled 2015-05-21: qty 2.5

## 2015-05-21 MED ORDER — INSULIN ASPART 100 UNIT/ML ~~LOC~~ SOLN
0.0000 [IU] | Freq: Three times a day (TID) | SUBCUTANEOUS | Status: DC
  Administered 2015-05-21: 3 [IU] via SUBCUTANEOUS
  Administered 2015-05-21: 5 [IU] via SUBCUTANEOUS
  Administered 2015-05-21: 3 [IU] via SUBCUTANEOUS
  Administered 2015-05-22: 1 [IU] via SUBCUTANEOUS
  Administered 2015-05-22 (×2): 3 [IU] via SUBCUTANEOUS
  Administered 2015-05-23: 5 [IU] via SUBCUTANEOUS
  Administered 2015-05-23 – 2015-05-24 (×4): 2 [IU] via SUBCUTANEOUS
  Filled 2015-05-21 (×2): qty 1

## 2015-05-21 MED ORDER — SODIUM CHLORIDE 0.9 % IV SOLN
INTRAVENOUS | Status: DC
  Administered 2015-05-21 – 2015-05-23 (×3): via INTRAVENOUS

## 2015-05-21 MED ORDER — ACETAMINOPHEN 650 MG RE SUPP: 650.0000 mg | Freq: Four times a day (QID) | RECTAL | Status: DC | PRN

## 2015-05-21 NOTE — ED Notes (Signed)
CBG 240 

## 2015-05-21 NOTE — ED Provider Notes (Signed)
CSN: [id number]     Arrival date & time 05/21/15  0404 History   First MD Initiated Contact with Patient 05/21/15 0422     Chief Complaint  Patient presents with  . Headache  . Fever     (Consider location/radiation/quality/duration/timing/severity/associated sxs/prior Treatment) HPI  [name] is a 82 y.o. female with no significant past medical history presenting today with headache. Patient states the right side of her head is pounding. She denies pain elsewhere. She states she's had a fever as high as 102. She's had increase in urinary frequency. No dysuria or hematuria. No nausea vomiting or diarrhea. No chest pain or shortness of breath. Patient took Percocet prior to arrival. There are no further complaints.  10 Systems reviewed and are negative for acute change except as noted in the HPI.     Past Medical History  Diagnosis Date  . Anemia   . GERD (gastroesophageal reflux disease)   . Diabetes mellitus   . Osteoporosis   . Diverticulitis    Past Surgical History  Procedure Laterality Date  . Appendectomy    . Tonsilectomy, adenoidectomy, bilateral myringotomy and tubes      x2 "it grew back"  . Cyst on wrist    . Cholecystectomy      1990s  . Left ankle fracture repair      1980s  . Tonsillectomy     No family history on file. Social History  Substance Use Topics  . Smoking status: Never Smoker   . Smokeless tobacco: None  . Alcohol Use: No   OB History    No data available     Review of Systems    Allergies  Nystatin  Home Medications   Prior to Admission medications   Medication Sig Start Date End Date Taking? Authorizing Provider  ALPRAZolam (XANAX) 0.25 MG tablet Take 0.25 mg by mouth 2 (two) times daily. 1/2 tablet morning and evening    Historical Provider, MD  aspirin 325 MG EC tablet Take 325 mg by mouth daily.    Historical Provider, MD  glimepiride (AMARYL) 4 MG tablet Take 4 mg by mouth 1 day or 1 dose. 1/2 tablet at noon     Historical Provider, MD  metFORMIN (GLUCOPHAGE-XR) 500 MG 24 hr tablet Take 500 mg by mouth 2 (two) times daily at 10 AM and 5 PM. 2 tablet in AM and 2 tablets PM    Historical Provider, MD  pantoprazole (PROTONIX) 40 MG tablet Take 40 mg by mouth daily.    Historical Provider, MD  polysaccharide iron (NIFEREX) 150 MG CAPS capsule Take 150 mg by mouth 2 (two) times daily.    Historical Provider, MD  Travoprost, BAK Free, (TRAVATAN) 0.004 % SOLN ophthalmic solution 1 drop at bedtime. 1 drop at bedtime in left eye    Historical Provider, MD   BP 127/55 mmHg  Pulse 113  Temp(Src) 103.1 F (39.5 C) (Oral)  Resp 18  Ht 5' (1.524 m)  Wt 144 lb (65.318 kg)  BMI 28.12 kg/m2  SpO2 91% Physical Exam  Constitutional: She is oriented to person, place, and time. She appears well-developed and well-nourished. No distress.  HENT:  Head: Normocephalic and atraumatic.  Nose: Nose normal.  Mouth/Throat: Oropharynx is clear and moist. No oropharyngeal exudate.  Eyes: Conjunctivae and EOM are normal. Pupils are equal, round, and reactive to light. No scleral icterus.  Neck: Normal range of motion. Neck supple. No JVD present. No tracheal deviation present. No thyromegaly present.  No meningismus  Cardiovascular: Regular rhythm and normal heart sounds.  Exam reveals no gallop and no friction rub.   No murmur heard. Tachycardic  Pulmonary/Chest: Effort normal and breath sounds normal. No respiratory distress. She has no wheezes. She exhibits no tenderness.  Abdominal: Soft. Bowel sounds are normal. She exhibits no distension and no mass. There is no tenderness. There is no rebound and no guarding.  Musculoskeletal: Normal range of motion. She exhibits no edema or tenderness.  Lymphadenopathy:    She has no cervical adenopathy.  Neurological: She is alert and oriented to person, place, and time. No cranial nerve deficit. She exhibits normal muscle tone.  Normal strength and sensation in all extremity.  Normal cerebellar testing.  Skin: Skin is warm and dry. No rash noted. No erythema. No pallor.  Subjective fever  Nursing note and vitals reviewed.   ED Course  Procedures (including critical care time) Labs Review Labs Reviewed  COMPREHENSIVE METABOLIC PANEL - Abnormal; Notable for the following:    CO2 19 (*)    Glucose, Bld 296 (*)    Total Protein 5.6 (*)    Albumin 3.2 (*)    ALT 9 (*)    GFR calc non Af Amer 54 (*)    Anion gap 16 (*)    All other components within normal limits  CBC WITH DIFFERENTIAL/PLATELET - Abnormal; Notable for the following:    Hemoglobin 11.9 (*)    MCH 25.5 (*)    Neutro Abs 9.2 (*)    Lymphs Abs 0.3 (*)    All other components within normal limits  URINALYSIS, ROUTINE W REFLEX MICROSCOPIC (NOT AT [facility]) - Abnormal; Notable for the following:    APPearance CLOUDY (*)    Glucose, UA >1000 (*)    Hgb urine dipstick SMALL (*)    Ketones, ur 15 (*)    Protein, ur 100 (*)    Nitrite POSITIVE (*)    Leukocytes, UA MODERATE (*)    All other components within normal limits  MAGNESIUM - Abnormal; Notable for the following:    Magnesium 1.3 (*)    All other components within normal limits  URINE MICROSCOPIC-ADD ON - Abnormal; Notable for the following:    Squamous Epithelial / LPF 0-5 (*)    Bacteria, UA FEW (*)    All other components within normal limits  I-STAT CG4 LACTIC ACID, ED - Abnormal; Notable for the following:    Lactic Acid, Venous 2.39 (*)    All other components within normal limits  CULTURE, BLOOD (ROUTINE X 2)  CULTURE, BLOOD (ROUTINE X 2)  URINE CULTURE  TYPE AND SCREEN  ABO/RH    Imaging Review Dg Chest Port 1 View  05/21/2015  CLINICAL DATA:  Fever and constipation for 1 day. EXAM: PORTABLE CHEST 1 VIEW COMPARISON:  None. FINDINGS: Mild cardiac enlargement. Normal pulmonary vascularity. Shallow inspiration with mild linear atelectasis in the lung bases. No focal consolidation. No blunting of costophrenic angles. No  pneumothorax. Calcified and tortuous aorta. IMPRESSION: Shallow inspiration with atelectasis in the lung bases. Cardiac enlargement without vascular congestion. Electronically Signed   By: Burman NievesWilliam  Stevens M.D.   On: 05/21/2015 05:05   I have personally reviewed and evaluated these images and lab results as part of my medical decision-making.   EKG Interpretation   Date/Time:  Tuesday May 21 2015 04:17:10 EDT Ventricular Rate:  99 PR Interval:  194 QRS Duration: 87 QT Interval:  329 QTC Calculation: 422 R Axis:   94  Text Interpretation:  Sinus tachycardia Premature ventricular complexes  PVCs are now present Confirmed by Erroll Luna [phone]) on  05/21/2015 4:38:58 AM      MDM   Final diagnoses:  None    Patient presents emergency department for fever. I retook her temperature and it was 103.1. Code sepsis was called. Patient given IV fluids and antibiotics. Also will send type and screen as patient states she recently required blood transfusion.  UA reveals UTI, likely the  Cause of her symptoms.  Magnesium was replaced.  Will admit to hospitalist for further care.\  5:50 AM Dr. Julian Reil accepts to med surg.  Family is aware.  Tomasita Crumble, MD 05/21/15 [phone]

## 2015-05-21 NOTE — Progress Notes (Signed)
Jarvis Newcomerheo H Lapinski is a 82 y.o. female patient admitted from ED awake, alert - oriented  X 4 - no acute distress noted.  VSS - Blood pressure 156/56, pulse 70, temperature 99 F (37.2 C), temperature source Oral, resp. rate 18, height 5' (1.524 m), weight 65.318 kg (144 lb), SpO2 94 %.    IV in place, occlusive dsg intact without redness.  Orientation to room, and floor completed with information packet given to patient/family.  Patient declined safety video at this time.  Admission INP armband ID verified with patient/family, and in place.   SR up x 2, fall assessment complete, with patient and family able to verbalize understanding of risk associated with falls, and verbalized understanding to call nsg before up out of bed.  Call light within reach, patient able to voice, and demonstrate understanding.  Skin, clean-dry- intact without evidence of bruising, or skin tears.   No evidence of skin break down noted on exam.     Will cont to eval and treat per MD orders.  Theressa StampsKayla L Price, RN 05/21/2015 7:02 PM

## 2015-05-21 NOTE — ED Notes (Signed)
Carb Mortified diet ordered for patient. 

## 2015-05-21 NOTE — ED Notes (Signed)
Ambulated to BR with minimal assistance -- uses a cane

## 2015-05-21 NOTE — ED Notes (Signed)
Checked patient cbg it was [id number] notified RN of blood sugar

## 2015-05-21 NOTE — ED Notes (Signed)
Ordered diet tray 

## 2015-05-21 NOTE — Progress Notes (Signed)
Pharmacy Antibiotic Note  [name] is a 82 y.o. female admitted on 05/21/2015 with HA/fever.  Pharmacy has been consulted for Ceftriaxone dosing for sepsis with urinary source.   Plan: -Ceftriaxone 2g IV q24h -F/U urine culture for directed therapy  Height: 5' (152.4 cm) Weight: 144 lb (65.318 kg) IBW/kg (Calculated) : 45.5  Temp (24hrs), Avg:101.2 F (38.4 C), Min:100.2 F (37.9 C), Max:103.1 F (39.5 C)   Recent Labs Lab 05/21/15 0432 05/21/15 0516  WBC 9.9  --   CREATININE 0.92  --   LATICACIDVEN  --  2.39*    Estimated Creatinine Clearance: 34.9 mL/min (by C-G formula based on Cr of 0.92).    Allergies  Allergen Reactions  . Nystatin Swelling    Abran DukeLedford, Lorrie Strauch 05/21/2015 5:59 AM

## 2015-05-21 NOTE — ED Notes (Signed)
[facility] c/o R sided headache and fever since yesterday. Pt also reports trembling to R side of jaw, no other neuro deficits noted. Pt took a Norco prior to arrival.

## 2015-05-21 NOTE — H&P (Signed)
History and Physical    [name] ZOX:[id number] DOB: 1923/03/22 DOA: 05/21/2015   PCP: Minda Meo, MD   Patient coming from/Resides with: Independent living facility/apartment-[facility]-lives alone  Chief Complaint: Fever and headache  HPI: [name] is a 82 y.o. female with medical history significant for diabetes on oral agents, hypertension, GERD and iron deficiency anemia. She presented to the ER because of fevers with chills and headache that had begun Monday evening. He is also been having low back pain with urinary frequency but no dysuria or foul-smelling urine. Because of the chills she presented to the ER. He denies history of recurrent headaches or headaches with fevers in the past. She denied any neurological symptoms such as visual disturbances or neurological signs.  ED Course:  MAXIMUM TEMPERATURE 103F PO-BP 120/87-pulse 103-respirations 31-room air saturations 92-93% 2 view chest x-ray: Shallow inspiration with atelectasis in the lung bases no acute findings Lab data: Sodium 136, potassium 3.9, CO2 19, BUN 14, creatinine 0.9 to, glucose 296, anion gap 16, magnesium 1.3, initial lactic acid 2.39 but decreased to 1.7 on after fluid challenges, WBCs 9900 with neutrophils elevated 94% and absolute neutrophils elevated 9.2%, hemoglobin 11.9, platelets 209,000; urinalysis highly abnormal with cloudy appearance, greater than 1000 glucose, ketones 15, leukocytes moderate, nitrite positive, protein 100, WBCs too numerous to count, urine culture and blood cultures obtained in the ER 2 L normal saline bolus given Zosyn 3.375 g IV 1 Vancomycin 1250 mg IV 1 Tylenol 1000 mg by mouth 1 Magnesium 2 g IV 1  Review of Systems:  In addition to the HPI above,  No changes with Vision or hearing, new weakness, tingling, numbness in any extremity, No problems swallowing food or Liquids, indigestion/reflux No Chest pain, Cough or Shortness of Breath, palpitations, orthopnea  or DOE No Abdominal pain, N/V; no melena or hematochezia, no dark tarry stools, Bowel movements are regular, No dysuria, hematuria or flank pain No new skin rashes, lesions, masses or bruises, No new joints pains-aches No recent weight gain or loss No polyuria, polydypsia or polyphagia,   Past Medical History  Diagnosis Date  . Anemia   . GERD (gastroesophageal reflux disease)   . Diabetes mellitus   . Osteoporosis   . Diverticulitis     Past Surgical History  Procedure Laterality Date  . Appendectomy    . Tonsilectomy, adenoidectomy, bilateral myringotomy and tubes      x2 "it grew back"  . Cyst on wrist    . Cholecystectomy      1990s  . Left ankle fracture repair      1980s  . Tonsillectomy       reports that she has never smoked. She does not have any smokeless tobacco history on file. She reports that she does not drink alcohol or use illicit drugs.  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 Nystatin Swelling    Family history reviewed and not pertinent Current admission diagnosis  Prior to Admission medications   Medication Sig Start Date End Date Taking? Authorizing Provider  amLODipine (NORVASC) 5 MG tablet Take 1 tablet by mouth daily. 05/17/15  Yes Historical Provider, MD  aspirin 325 MG EC tablet Take 325 mg by mouth daily.   Yes Historical Provider, MD  Chlorpheniramine Maleate (CHLOR-TRIMETON ALLERGY PO) Take 1 tablet by mouth daily.   Yes Historical Provider, MD  glimepiride (AMARYL) 4 MG tablet Take 4 mg by mouth 1 day or 1  dose. 1/2 tablet at noon   Yes Historical Provider, MD  metFORMIN (GLUCOPHAGE-XR) 500 MG 24 hr tablet Take 500 mg by mouth 2 (two) times daily at 10 AM and 5 PM. 2 tablet in AM and 2 tablets PM   Yes Historical Provider, MD  pantoprazole (PROTONIX) 40 MG tablet Take 40 mg by mouth daily.   Yes Historical Provider, MD  polysaccharide iron (NIFEREX) 150 MG CAPS capsule Take 150 mg  by mouth 2 (two) times daily.   Yes Historical Provider, MD  Travoprost, BAK Free, (TRAVATAN) 0.004 % SOLN ophthalmic solution 1 drop at bedtime. 1 drop at bedtime in left eye   Yes Historical Provider, MD    Physical Exam: Filed Vitals:   05/21/15 0747 05/21/15 0800 05/21/15 0815 05/21/15 0823  BP:  120/50 130/63 130/63  Pulse:  81 82 82  Temp:      TempSrc:      Resp:   24 26  Height:      Weight:      SpO2: 86% 93% 94% 93%      Constitutional: NAD, calm, comfortable Eyes: PERRL, lids and conjunctivae normal ENMT: Mucous membranes are moist. Posterior pharynx clear of any exudate or lesions.Normal dentition.  Neck: normal, supple, no masses, no thyromegaly Respiratory: clear to auscultation bilaterally, no wheezing, no crackles. Normal respiratory effort when seated upright in stretcher but when patient sleeping she had slid down in stretcher and was noted to not be taking very deep respirations and her O2 sats would drop to 86%. No accessory muscle use. RA Cardiovascular: Regular rate and rhythm, grade 2/6 systolic murmur. No rubs / gallops. No extremity edema. 2+ pedal pulses. No carotid bruits.  Abdomen: no tenderness, no masses palpated. No hepatosplenomegaly. Bowel sounds positive.  Musculoskeletal: no clubbing / cyanosis. No joint deformity upper and lower extremities. Good ROM, no contractures. Normal muscle tone.  Skin: no rashes, lesions, ulcers. No induration Neurologic: CN 2-12 grossly intact. Sensation intact, DTR normal. Strength 5/5 x all 4 extremities.  Psychiatric: Normal judgment and insight. Alert and oriented x 3. Normal mood.    Labs on Admission: I have personally reviewed following labs and imaging studies  CBC:  Recent Labs Lab 05/21/15 0432  WBC 9.9  NEUTROABS 9.2*  HGB 11.9*  HCT 37.7  MCV 80.7  PLT 209   Basic Metabolic Panel:  Recent Labs Lab 05/21/15 0432  NA 136  K 3.9  CL 101  CO2 19*  GLUCOSE 296*  BUN 14  CREATININE 0.92    CALCIUM 9.5  MG 1.3*   GFR: Estimated Creatinine Clearance: 34.9 mL/min (by C-G formula based on Cr of 0.92). Liver Function Tests:  Recent Labs Lab 05/21/15 0432  AST 19  ALT 9*  ALKPHOS 70  BILITOT 1.0  PROT 5.6*  ALBUMIN 3.2*   No results for input(s): LIPASE, AMYLASE in the last 168 hours. No results for input(s): AMMONIA in the last 168 hours. Coagulation Profile: No results for input(s): INR, PROTIME in the last 168 hours. Cardiac Enzymes: No results for input(s): CKTOTAL, CKMB, CKMBINDEX, TROPONINI in the last 168 hours. BNP (last 3 results) No results for input(s): PROBNP in the last 8760 hours. HbA1C: No results for input(s): HGBA1C in the last 72 hours. CBG:  Recent Labs Lab 05/21/15 0821  GLUCAP 240*   Lipid Profile: No results for input(s): CHOL, HDL, LDLCALC, TRIG, CHOLHDL, LDLDIRECT in the last 72 hours. Thyroid Function Tests: No results for input(s): TSH, T4TOTAL, FREET4, T3FREE, THYROIDAB  in the last 72 hours. Anemia Panel: No results for input(s): VITAMINB12, FOLATE, FERRITIN, TIBC, IRON, RETICCTPCT in the last 72 hours. Urine analysis:    Component Value Date/Time   COLORURINE YELLOW 05/21/2015 0437   APPEARANCEUR CLOUDY* 05/21/2015 0437   LABSPEC 1.016 05/21/2015 0437   PHURINE 6.0 05/21/2015 0437   GLUCOSEU >1000* 05/21/2015 0437   HGBUR SMALL* 05/21/2015 0437   BILIRUBINUR NEGATIVE 05/21/2015 0437   KETONESUR 15* 05/21/2015 0437   PROTEINUR 100* 05/21/2015 0437   NITRITE POSITIVE* 05/21/2015 0437   LEUKOCYTESUR MODERATE* 05/21/2015 0437   Sepsis Labs: @LABRCNTIP (procalcitonin:4,lacticidven:4) )No results found for this or any previous visit (from the past 240 hour(s)).   Radiological Exams on Admission: Dg Chest Port 1 View  05/21/2015  CLINICAL DATA:  Fever and constipation for 1 day. EXAM: PORTABLE CHEST 1 VIEW COMPARISON:  None. FINDINGS: Mild cardiac enlargement. Normal pulmonary vascularity. Shallow inspiration with mild linear  atelectasis in the lung bases. No focal consolidation. No blunting of costophrenic angles. No pneumothorax. Calcified and tortuous aorta. IMPRESSION: Shallow inspiration with atelectasis in the lung bases. Cardiac enlargement without vascular congestion. Electronically Signed   By: Burman Nieves M.D.   On: 05/21/2015 05:05    EKG: (Independently reviewed) sinus tachycardia with ventricular rate 99 bpm, several unifocal PVCs, QTC 422 ms, no definitive ST segment elevation, rightward axis leads 3 and aVF, question appropriate lead placement  Assessment/Plan Principal Problem:   UTI (lower urinary tract infection) -Patient's presenting symptoms consistent with urinary tract infection -Empiric Rocephin -Follow up on blood and urine cultures  Active Problems:   SIRS (systemic inflammatory response syndrome)  -Presented with several SIRS criteria: Fever greater than 100.9, heart rate greater than 90, initial serum lactate greater than 2 -Patient is currently hemodynamically stable and lactate has normalized after volume repletion    Hypoventilation -Patient with documented hypoventilation with resultant hypoxemia due to malpositioning in the stretcher -Monitor for nocturnal hypoxemia; prn O2 ordered -Incentive spirometry    Headache -Not associated with neurological signs and patient does not have any upper respiratory symptoms therefore do not suspect influenza -Symptomatic treatment    Diabetes mellitus type 2, uncontrolled  -CBG 296 with slightly elevated anion gap and setting of volume depletion -Continue home Amaryl to hold metformin -Follow CBGs and provide SSI -HgbA1c    HTN (hypertension) -Blood pressure controlled -Continue Norvasc    GERD (gastroesophageal reflux disease) -Continue PPI    Fe deficiency anemia -Hemoglobin stable and at baseline of around 11.9 -Continue iron supplementation    Hypomagnesemia -Likely related to use of PPI at home -Given magnesium and  ER -Repeat level in a.m.      DVT prophylaxis: Lovenox Code Status: Full code  Family Communication: No family at bedside Disposition Plan: Anticipate discharge back to preadmission home environment once medically stable Consults called: None  Admission status: Inpatient/medical floor     Garon Melander L. ANP-BC [facility] Pager [phone]   If 7PM-7AM, please contact night-coverage [email or web address] Password [facility]  05/21/2015, 8:49 AM

## 2015-05-21 NOTE — Progress Notes (Signed)
Systolic blood pressure increasing up to 180s so have decreased IV fluids to 50 mL per hour  Junious SilkAllison Ellis, ANP

## 2015-05-22 DIAGNOSIS — R51 Headache: Secondary | ICD-10-CM

## 2015-05-22 DIAGNOSIS — D509 Iron deficiency anemia, unspecified: Secondary | ICD-10-CM

## 2015-05-22 DIAGNOSIS — I10 Essential (primary) hypertension: Secondary | ICD-10-CM

## 2015-05-22 DIAGNOSIS — E118 Type 2 diabetes mellitus with unspecified complications: Secondary | ICD-10-CM

## 2015-05-22 DIAGNOSIS — R0689 Other abnormalities of breathing: Secondary | ICD-10-CM

## 2015-05-22 DIAGNOSIS — E1165 Type 2 diabetes mellitus with hyperglycemia: Secondary | ICD-10-CM

## 2015-05-22 DIAGNOSIS — K219 Gastro-esophageal reflux disease without esophagitis: Secondary | ICD-10-CM

## 2015-05-22 DIAGNOSIS — N390 Urinary tract infection, site not specified: Secondary | ICD-10-CM

## 2015-05-22 DIAGNOSIS — R6510 Systemic inflammatory response syndrome (SIRS) of non-infectious origin without acute organ dysfunction: Secondary | ICD-10-CM

## 2015-05-22 DIAGNOSIS — A419 Sepsis, unspecified organism: Secondary | ICD-10-CM

## 2015-05-22 LAB — BASIC METABOLIC PANEL
Anion gap: 10 (ref 5–15)
BUN: 11 mg/dL (ref 6–20)
CALCIUM: 8.5 mg/dL — AB (ref 8.9–10.3)
CHLORIDE: 107 mmol/L (ref 101–111)
CO2: 23 mmol/L (ref 22–32)
CREATININE: 0.81 mg/dL (ref 0.44–1.00)
Glucose, Bld: 159 mg/dL — ABNORMAL HIGH (ref 65–99)
Potassium: 3.1 mmol/L — ABNORMAL LOW (ref 3.5–5.1)
SODIUM: 140 mmol/L (ref 135–145)

## 2015-05-22 LAB — MAGNESIUM: MAGNESIUM: 2.1 mg/dL (ref 1.7–2.4)

## 2015-05-22 LAB — CBC
HCT: 34.7 % — ABNORMAL LOW (ref 36.0–46.0)
HEMOGLOBIN: 11.1 g/dL — AB (ref 12.0–15.0)
MCH: 25.8 pg — ABNORMAL LOW (ref 26.0–34.0)
MCHC: 32 g/dL (ref 30.0–36.0)
MCV: 80.5 fL (ref 78.0–100.0)
PLATELETS: 215 10*3/uL (ref 150–400)
RBC: 4.31 MIL/uL (ref 3.87–5.11)
RDW: 15.4 % (ref 11.5–15.5)
WBC: 9.3 10*3/uL (ref 4.0–10.5)

## 2015-05-22 LAB — BLOOD CULTURE ID PANEL (REFLEXED)
Acinetobacter baumannii: NOT DETECTED
CANDIDA ALBICANS: NOT DETECTED
CANDIDA KRUSEI: NOT DETECTED
CANDIDA PARAPSILOSIS: NOT DETECTED
CANDIDA TROPICALIS: NOT DETECTED
CARBAPENEM RESISTANCE: NOT DETECTED
Candida glabrata: NOT DETECTED
ENTEROCOCCUS SPECIES: NOT DETECTED
Enterobacter cloacae complex: NOT DETECTED
Enterobacteriaceae species: NOT DETECTED
Escherichia coli: DETECTED — AB
Haemophilus influenzae: NOT DETECTED
KLEBSIELLA OXYTOCA: NOT DETECTED
KLEBSIELLA PNEUMONIAE: NOT DETECTED
Listeria monocytogenes: NOT DETECTED
Methicillin resistance: NOT DETECTED
Neisseria meningitidis: NOT DETECTED
PROTEUS SPECIES: NOT DETECTED
PSEUDOMONAS AERUGINOSA: NOT DETECTED
STAPHYLOCOCCUS AUREUS BCID: NOT DETECTED
STAPHYLOCOCCUS SPECIES: NOT DETECTED
STREPTOCOCCUS AGALACTIAE: NOT DETECTED
STREPTOCOCCUS PNEUMONIAE: NOT DETECTED
Serratia marcescens: NOT DETECTED
Streptococcus pyogenes: NOT DETECTED
Streptococcus species: NOT DETECTED
VANCOMYCIN RESISTANCE: NOT DETECTED

## 2015-05-22 LAB — GLUCOSE, CAPILLARY
GLUCOSE-CAPILLARY: 135 mg/dL — AB (ref 65–99)
GLUCOSE-CAPILLARY: 241 mg/dL — AB (ref 65–99)
Glucose-Capillary: 234 mg/dL — ABNORMAL HIGH (ref 65–99)
Glucose-Capillary: 225 mg/dL — ABNORMAL HIGH (ref 65–99)

## 2015-05-22 LAB — HEMOGLOBIN A1C
Hgb A1c MFr Bld: 8.9 % — ABNORMAL HIGH (ref 4.8–5.6)
Mean Plasma Glucose: 209 mg/dL

## 2015-05-22 MED ORDER — POTASSIUM CHLORIDE CRYS ER 20 MEQ PO TBCR
40.0000 meq | EXTENDED_RELEASE_TABLET | Freq: Once | ORAL | Status: AC
  Administered 2015-05-22: 40 meq via ORAL
  Filled 2015-05-22: qty 2

## 2015-05-22 MED ORDER — GLIMEPIRIDE 2 MG PO TABS
2.0000 mg | ORAL_TABLET | Freq: Every day | ORAL | Status: DC
  Administered 2015-05-22 – 2015-05-24 (×3): 2 mg via ORAL
  Filled 2015-05-22 (×3): qty 1

## 2015-05-22 MED ORDER — HYDRALAZINE HCL 20 MG/ML IJ SOLN
10.0000 mg | Freq: Four times a day (QID) | INTRAMUSCULAR | Status: DC | PRN
  Administered 2015-05-22 – 2015-05-24 (×2): 10 mg via INTRAVENOUS
  Filled 2015-05-22 (×2): qty 1

## 2015-05-22 NOTE — Progress Notes (Signed)
PROGRESS NOTE    [name]  OZH:[id number] DOB: Nov 24, 1923 DOA: 05/21/2015 PCP: Elizabeth MeoARONSON,Elizabeth A, MD   Brief Narrative:  HPI on 05/21/2015 by Ms. Chiquita Lothllison Ellis [name] is Koch 82 y.o. female with medical history significant for diabetes on oral agents, hypertension, GERD and iron deficiency anemia. She presented to the ER because of fevers with chills and headache that had begun Monday evening. He is also been having low back pain with urinary frequency but no dysuria or foul-smelling urine. Because of the chills she presented to the ER. He denies history of recurrent headaches or headaches with fevers in the past. She denied any neurological symptoms such as visual disturbances or neurological signs.  Assessment & Plan   Sepsis secondary to UTI (lower urinary tract infection)/Escherichia coli bacteremia -Patient's presenting symptoms consistent with urinary tract infection -Upon admission, patient was febrile, temperature 100.101F, heart rate greater than 90, lactic acid 2.39 -Continue ceftriaxone -Currently afebrile, no leukocytosis -Blood culture shows Escherichia coli, urine culture pending -Continue IVF  Hypoventilation -Improved -Monitor for nocturnal hypoxemia; prn O2 ordered -Incentive spirometry  Headache -Not associated with neurological signs and patient does not have any upper respiratory symptoms therefore do not suspect influenza -Symptomatic treatment  Diabetes mellitus type 2, uncontrolled  -Continue home Amaryl, metformin held -Continue insulin sliding scale with CBG monitoring -Hemoglobin A1c 8.9 -Patient Koch need to follow-up with her primary care physician on discharge for tighter and better hyperglycemic control  Essential hypertension -Continue Norvasc  GERD (gastroesophageal reflux disease) -Continue PPI  Iron deficiency anemia -Hemoglobin stable, currently 11.1 -Continue iron supplementation -Continue to monitor  CBC  Hypomagnesemia -Magnesium supplemented, only 2.1  Hypokalemia -Replacing continue to monitor BMP  DVT Prophylaxis  Lovenox  Code Status: Full  Family Communication: None at bedside  Disposition Plan: Admitted. Pending improvement in symptoms  Consultants None  Procedures  None  Antibiotics   Anti-infectives    Start     Dose/Rate Route Frequency Ordered Stop   05/21/15 1200  cefTRIAXone (ROCEPHIN) 2 g in dextrose 5 % 50 mL IVPB     2 g 100 mL/hr over 30 Minutes Intravenous Every 24 hours 05/21/15 0558     05/21/15 0430  piperacillin-tazobactam (ZOSYN) IVPB 3.375 g     3.375 g 100 mL/hr over 30 Minutes Intravenous  Once 05/21/15 0428 05/21/15 0540   05/21/15 0430  vancomycin (VANCOCIN) 1,250 mg in sodium chloride 0.9 % 250 mL IVPB     1,250 mg 166.7 mL/hr over 90 Minutes Intravenous  Once 05/21/15 0428 05/21/15 0652      Subjective:   [name] Munns seen and examined today.  Patient complains of sharp headache lasting Koch few seconds that occurs on the right side of the back of her head. She states Tylenol has been helping. Denies any visual changes. Denies any exacerbation of her symptoms with light or sound. Currently denies any chest pain, shortness of breath, abdominal pain, nausea vomiting, diarrhea or constipation. Does state she is feeling mildly better as compared to admission.  Objective:   Filed Vitals:   05/21/15 2040 05/22/15 0435 05/22/15 0903 05/22/15 1257  BP: 139/75 155/69 193/64 151/69  Pulse: 74 64  74  Temp: 98.1 F (36.7 C) 98.1 F (36.7 C)  98.4 F (36.9 C)  TempSrc: Oral Oral  Oral  Resp: 18 18  16   Height:      Weight:      SpO2: 93% 94%  95%    Intake/Output Summary (Last 24  hours) at 05/22/15 1319 Last data filed at 05/22/15 1139  Gross per 24 hour  Intake 1963.75 ml  Output      0 ml  Net 1963.75 ml   Filed Weights   05/21/15 0413  Weight: 65.318 kg (144 lb)    Exam  General: Well developed, elderly, NAD, appears stated  age  HEENT: NCAT, mucous membranes moist.   Cardiovascular: S1 S2 auscultated, 2/6 SEM, RRR  Respiratory: Clear to auscultation bilaterally   Abdomen: Soft, nontender, nondistended, + bowel sounds  Extremities: warm dry without cyanosis clubbing or edema  Neuro: AAOx3, nonfocal  Psych: Normal affect and demeanor    Data Reviewed: I have personally reviewed following labs and imaging studies  CBC:  Recent Labs Lab 05/21/15 0432 05/22/15 0448  WBC 9.9 9.3  NEUTROABS 9.2*  --   HGB 11.9* 11.1*  HCT 37.7 34.7*  MCV 80.7 80.5  PLT 209 215   Basic Metabolic Panel:  Recent Labs Lab 05/21/15 0432 05/22/15 0448  NA 136 140  K 3.9 3.1*  CL 101 107  CO2 19* 23  GLUCOSE 296* 159*  BUN 14 11  CREATININE 0.92 0.81  CALCIUM 9.5 8.5*  MG 1.3* 2.1   GFR: Estimated Creatinine Clearance: 39.7 mL/min (by C-G formula based on Cr of 0.81). Liver Function Tests:  Recent Labs Lab 05/21/15 0432  AST 19  ALT 9*  ALKPHOS 70  BILITOT 1.0  PROT 5.6*  ALBUMIN 3.2*   No results for input(s): LIPASE, AMYLASE in the last 168 hours. No results for input(s): AMMONIA in the last 168 hours. Coagulation Profile: No results for input(s): INR, PROTIME in the last 168 hours. Cardiac Enzymes: No results for input(s): CKTOTAL, CKMB, CKMBINDEX, TROPONINI in the last 168 hours. BNP (last 3 results) No results for input(s): PROBNP in the last 8760 hours. HbA1C:  Recent Labs  05/21/15 0432  HGBA1C 8.9*   CBG:  Recent Labs Lab 05/21/15 1141 05/21/15 1704 05/21/15 1954 05/22/15 0800 05/22/15 1208  GLUCAP 287* 207* 222* 135* 234*   Lipid Profile: No results for input(s): CHOL, HDL, LDLCALC, TRIG, CHOLHDL, LDLDIRECT in the last 72 hours. Thyroid Function Tests: No results for input(s): TSH, T4TOTAL, FREET4, T3FREE, THYROIDAB in the last 72 hours. Anemia Panel: No results for input(s): VITAMINB12, FOLATE, FERRITIN, TIBC, IRON, RETICCTPCT in the last 72 hours. Urine  analysis:    Component Value Date/Time   COLORURINE YELLOW 05/21/2015 0437   APPEARANCEUR CLOUDY* 05/21/2015 0437   LABSPEC 1.016 05/21/2015 0437   PHURINE 6.0 05/21/2015 0437   GLUCOSEU >1000* 05/21/2015 0437   HGBUR SMALL* 05/21/2015 0437   BILIRUBINUR NEGATIVE 05/21/2015 0437   KETONESUR 15* 05/21/2015 0437   PROTEINUR 100* 05/21/2015 0437   NITRITE POSITIVE* 05/21/2015 0437   LEUKOCYTESUR MODERATE* 05/21/2015 0437   Sepsis Labs: (procalcitonin:4,lacticidven:4)  ) Recent Results (from the past 240 hour(s))  Blood Culture (routine x 2)     Status: None (Preliminary result)   Collection Time: 05/21/15  5:10 AM  Result Value Ref Range Status   Specimen Description BLOOD LEFT ARM  Final   Special Requests BOTTLES DRAWN AEROBIC AND ANAEROBIC  Final   Culture  Setup Time   Final    GRAM NEGATIVE RODS AEROBIC BOTTLE ONLY Organism ID to follow CRITICAL RESULT CALLED TO, READ BACK BY AND VERIFIED WITH: GREG ABBOTT  05/22/15 MKELLY    Culture TOO YOUNG TO READ  Final   Report Status PENDING  Incomplete  Blood Culture ID Panel (  Reflexed)     Status: Abnormal   Collection Time: 05/21/15  5:10 AM  Result Value Ref Range Status   Enterococcus species NOT DETECTED NOT DETECTED Final   Vancomycin resistance NOT DETECTED NOT DETECTED Final   Listeria monocytogenes NOT DETECTED NOT DETECTED Final   Staphylococcus species NOT DETECTED NOT DETECTED Final   Staphylococcus aureus NOT DETECTED NOT DETECTED Final   Methicillin resistance NOT DETECTED NOT DETECTED Final   Streptococcus species NOT DETECTED NOT DETECTED Final   Streptococcus agalactiae NOT DETECTED NOT DETECTED Final   Streptococcus pneumoniae NOT DETECTED NOT DETECTED Final   Streptococcus pyogenes NOT DETECTED NOT DETECTED Final   Acinetobacter baumannii NOT DETECTED NOT DETECTED Final   Enterobacteriaceae species NOT DETECTED NOT DETECTED Final   Enterobacter cloacae complex NOT DETECTED NOT DETECTED  Final   Escherichia coli DETECTED (Koch) NOT DETECTED Final    Comment: GREG ABBOTT @0303  05/22/15 MKELLY   Klebsiella oxytoca NOT DETECTED NOT DETECTED Final   Klebsiella pneumoniae NOT DETECTED NOT DETECTED Final   Proteus species NOT DETECTED NOT DETECTED Final   Serratia marcescens NOT DETECTED NOT DETECTED Final   Carbapenem resistance NOT DETECTED NOT DETECTED Final   Haemophilus influenzae NOT DETECTED NOT DETECTED Final   Neisseria meningitidis NOT DETECTED NOT DETECTED Final   Pseudomonas aeruginosa NOT DETECTED NOT DETECTED Final   Candida albicans NOT DETECTED NOT DETECTED Final   Candida glabrata NOT DETECTED NOT DETECTED Final   Candida krusei NOT DETECTED NOT DETECTED Final   Candida parapsilosis NOT DETECTED NOT DETECTED Final   Candida tropicalis NOT DETECTED NOT DETECTED Final      Radiology Studies: Dg Chest Port 1 View  05/21/2015  CLINICAL DATA:  Fever and constipation for 1 day. EXAM: PORTABLE CHEST 1 VIEW COMPARISON:  None. FINDINGS: Mild cardiac enlargement. Normal pulmonary vascularity. Shallow inspiration with mild linear atelectasis in the lung bases. No focal consolidation. No blunting of costophrenic angles. No pneumothorax. Calcified and tortuous aorta. IMPRESSION: Shallow inspiration with atelectasis in the lung bases. Cardiac enlargement without vascular congestion. Electronically Signed   By: Burman Nieves M.D.   On: 05/21/2015 05:05     Scheduled Meds: . amLODipine  5 mg Oral Daily  . aspirin  325 mg Oral Daily  . cefTRIAXone (ROCEPHIN)  IV  2 g Intravenous Q24H  . enoxaparin (LOVENOX) injection  40 mg Subcutaneous Q24H  . glimepiride  2 mg Oral Q lunch  . insulin aspart  0-5 Units Subcutaneous QHS  . insulin aspart  0-9 Units Subcutaneous TID WC  . iron polysaccharides  150 mg Oral BID  . latanoprost  1 drop Both Eyes QHS  . pantoprazole  40 mg Oral Daily  . potassium chloride  40 mEq Oral Once   Continuous Infusions: . sodium chloride 50 mL/hr  at 05/21/15 1531     LOS: 1 day   Time Spent in minutes   30 minutes  Nadirah Socorro D.O. on 05/22/2015 at 1:19 PM  Between 7am to 7pm - Pager - [phone]  After 7pm go to [email or web address] - password [email or web address]  And look for the night coverage person covering for me after hours  [facility] Office  [phone]

## 2015-05-22 NOTE — Progress Notes (Signed)
[facility] no CSW involvement needed for independent level of care- please reconsult if pt is needing higher level of care at DC  [facility] Costa MesaJenna Holoman, South Coast Global Medical CenterCSWA Clinical Social Worker [phone]

## 2015-05-22 NOTE — Progress Notes (Signed)
Pharmacy Antibiotic Note  [name] is a 82 y.o. female admitted on 05/21/2015 with urosepis, currently receiving Rocephin 2 g IV q24h.  Rapid blood culture results show E. coli.    Plan: BCID results were discussed with K Schorr, NP and the patient's antibiotics will be continued as ordered. Further narrowing will be determined based on finalized susceptibilities.  Height: 5' (152.4 cm) Weight: 144 lb (65.318 kg) IBW/kg (Calculated) : 45.5  Temp (24hrs), Avg:100 F (37.8 C), Min:98.1 F (36.7 C), Max:103.1 F (39.5 C)   Recent Labs Lab 05/21/15 0432 05/21/15 0516 05/21/15 0730  WBC 9.9  --   --   CREATININE 0.92  --   --   LATICACIDVEN  --  2.39* 1.79    Estimated Creatinine Clearance: 34.9 mL/min (by C-G formula based on Cr of 0.92).    Allergies  Allergen Reactions  . Nystatin Swelling    [name], [name] 05/22/2015 3:48 AM

## 2015-05-23 LAB — URINE CULTURE

## 2015-05-23 LAB — BASIC METABOLIC PANEL
ANION GAP: 11 (ref 5–15)
BUN: 9 mg/dL (ref 6–20)
CALCIUM: 8.7 mg/dL — AB (ref 8.9–10.3)
CO2: 22 mmol/L (ref 22–32)
CREATININE: 0.7 mg/dL (ref 0.44–1.00)
Chloride: 104 mmol/L (ref 101–111)
GLUCOSE: 189 mg/dL — AB (ref 65–99)
Potassium: 3.9 mmol/L (ref 3.5–5.1)
Sodium: 137 mmol/L (ref 135–145)

## 2015-05-23 LAB — GLUCOSE, CAPILLARY
Glucose-Capillary: 184 mg/dL — ABNORMAL HIGH (ref 65–99)
Glucose-Capillary: 259 mg/dL — ABNORMAL HIGH (ref 65–99)
Glucose-Capillary: 182 mg/dL — ABNORMAL HIGH (ref 65–99)
Glucose-Capillary: 211 mg/dL — ABNORMAL HIGH (ref 65–99)

## 2015-05-23 LAB — CBC
HEMATOCRIT: 36.2 % (ref 36.0–46.0)
Hemoglobin: 11.5 g/dL — ABNORMAL LOW (ref 12.0–15.0)
MCH: 25.6 pg — ABNORMAL LOW (ref 26.0–34.0)
MCHC: 31.8 g/dL (ref 30.0–36.0)
MCV: 80.4 fL (ref 78.0–100.0)
PLATELETS: 228 10*3/uL (ref 150–400)
RBC: 4.5 MIL/uL (ref 3.87–5.11)
RDW: 15.5 % (ref 11.5–15.5)
WBC: 6.1 10*3/uL (ref 4.0–10.5)

## 2015-05-23 MED ORDER — BISACODYL 10 MG RE SUPP
10.0000 mg | Freq: Once | RECTAL | Status: AC
  Administered 2015-05-23: 10 mg via RECTAL
  Filled 2015-05-23: qty 1

## 2015-05-23 MED ORDER — POLYETHYLENE GLYCOL 3350 17 G PO PACK
17.0000 g | PACK | Freq: Every day | ORAL | Status: DC
  Administered 2015-05-23 – 2015-05-24 (×2): 17 g via ORAL
  Filled 2015-05-23 (×2): qty 1

## 2015-05-23 MED ORDER — DOCUSATE SODIUM 100 MG PO CAPS
100.0000 mg | ORAL_CAPSULE | Freq: Two times a day (BID) | ORAL | Status: DC
  Administered 2015-05-23 – 2015-05-24 (×3): 100 mg via ORAL
  Filled 2015-05-23 (×3): qty 1

## 2015-05-23 NOTE — Care Management Important Message (Signed)
Important Message  Patient Details  Name: [name] MRN: [id number] Date of Birth: 15-Apr-1923   Medicare Important Message Given:  Yes    Lawerance SabalDebbie Marlea Gambill, RN 05/23/2015, 12:25 PMImportant Message  Patient Details  Name: [name] MRN: [id number] Date of Birth: 15-Apr-1923   Medicare Important Message Given:  Yes    Lawerance Sabalebbie Dardan Shelton, RN 05/23/2015, 12:25 PM

## 2015-05-23 NOTE — Progress Notes (Signed)
PROGRESS NOTE    [name]  ZOX:[id number] DOB: 08-16-1923 DOA: 05/21/2015 PCP: Minda Meo, MD   Brief Narrative:  HPI on 05/21/2015 by Ms. Demara Lover is a 82 y.o. female with medical history significant for diabetes on oral agents, hypertension, GERD and iron deficiency anemia. She presented to the ER because of fevers with chills and headache that had begun Monday evening. He is also been having low back pain with urinary frequency but no dysuria or foul-smelling urine. Because of the chills she presented to the ER. He denies history of recurrent headaches or headaches with fevers in the past. She denied any neurological symptoms such as visual disturbances or neurological signs.  Assessment & Plan   Sepsis secondary to UTI (lower urinary tract infection)/Escherichia coli bacteremia -Patient's presenting symptoms consistent with urinary tract infection -Upon admission, patient was febrile, temperature 100.44F, heart rate greater than 90, lactic acid 2.39 -Continue ceftriaxone -Currently afebrile, no leukocytosis -Blood and urine cultures show Escherichia coli, pending sensitivities  Hypoventilation -Improved -Monitor for nocturnal hypoxemia; prn O2 ordered -Incentive spirometry  Headache -Not associated with neurological signs and patient does not have any upper respiratory symptoms therefore do not suspect influenza -Symptomatic treatment  Diabetes mellitus type 2, uncontrolled  -Continue home Amaryl, metformin held -Continue insulin sliding scale with CBG monitoring -Hemoglobin A1c 8.9 -Patient will need to follow-up with her primary care physician on discharge for tighter and better hyperglycemic control  Essential hypertension -Continue Norvasc  GERD (gastroesophageal reflux disease) -Continue PPI  Iron deficiency anemia -Hemoglobin stable, currently 11.5 -Continue iron supplementation -Continue to monitor CBC  Hypomagnesemia -Magnesium  supplemented, only 2.1  Hypokalemia -Resolved, continue to monitor BMP  Deconditioning  -Patient does feel weak, resides at an independent living facility. -Will consult PT notes fever evaluation and treatment.  DVT Prophylaxis  Lovenox  Code Status: Full  Family Communication: Daughter at bedside  Disposition Plan: Admitted. Possible discharge within 24 hours. Pending sensitivities, PT and OT evaluation.  Consultants None  Procedures  None  Antibiotics   Anti-infectives    Start     Dose/Rate Route Frequency Ordered Stop   05/21/15 1200  cefTRIAXone (ROCEPHIN) 2 g in dextrose 5 % 50 mL IVPB     2 g 100 mL/hr over 30 Minutes Intravenous Every 24 hours 05/21/15 0558     05/21/15 0430  piperacillin-tazobactam (ZOSYN) IVPB 3.375 g     3.375 g 100 mL/hr over 30 Minutes Intravenous  Once 05/21/15 0428 05/21/15 0540   05/21/15 0430  vancomycin (VANCOCIN) 1,250 mg in sodium chloride 0.9 % 250 mL IVPB     1,250 mg 166.7 mL/hr over 90 Minutes Intravenous  Once 05/21/15 0428 05/21/15 0652      Subjective:   [name] seen and examined today. Patient states she's feeling much better today. Denies any chest pain, shortness breath, abdominal pain, nausea or vomiting. Does feel generally weak.  Objective:   Filed Vitals:   05/22/15 1257 05/22/15 2147 05/22/15 2335 05/23/15 0459  BP: 151/69 182/61 168/64 179/66  Pulse: 74 80 73 68  Temp: 98.4 F (36.9 C) 99.3 F (37.4 C)  98.3 F (36.8 C)  TempSrc: Oral     Resp: Height:      Weight:      SpO2: 95% 95% 81% 95%    Intake/Output Summary (Last 24 hours) at 05/23/15 1123 Last data filed at 05/23/15 0916  Gross per 24 hour  Intake  2090 ml  Output    600 ml  Net   1490 ml   Filed Weights   05/21/15 0413  Weight: 65.318 kg (144 lb)    Exam  General: Well developed, elderly, NAD  HEENT: NCAT, mucous membranes moist.   Cardiovascular: S1 S2 auscultated, 2/6 SEM, RRR  Respiratory: Clear to  auscultation bilaterally   Abdomen: Soft, nontender, nondistended, + bowel sounds  Extremities: warm dry without cyanosis clubbing or edema  Neuro: AAOx3, nonfocal  Psych: Normal affect and demeanor, pleasant   Data Reviewed: I have personally reviewed following labs and imaging studies  CBC:  Recent Labs Lab 05/21/15 0432 05/22/15 0448 05/23/15 0439  WBC 9.9 9.3 6.1  NEUTROABS 9.2*  --   --   HGB 11.9* 11.1* 11.5*  HCT 37.7 34.7* 36.2  MCV 80.7 80.5 80.4  PLT 209 215 228   Basic Metabolic Panel:  Recent Labs Lab 05/21/15 0432 05/22/15 0448 05/23/15 0439  NA 136 140 137  K 3.9 3.1* 3.9  CL 101 107 104  CO2 19* 23 22  GLUCOSE 296* 159* 189*  BUN 14 11 9   CREATININE 0.92 0.81 0.70  CALCIUM 9.5 8.5* 8.7*  MG 1.3* 2.1  --    GFR: Estimated Creatinine Clearance: 40.2 mL/min (by C-G formula based on Cr of 0.7). Liver Function Tests:  Recent Labs Lab 05/21/15 0432  AST 19  ALT 9*  ALKPHOS 70  BILITOT 1.0  PROT 5.6*  ALBUMIN 3.2*   No results for input(s): LIPASE, AMYLASE in the last 168 hours. No results for input(s): AMMONIA in the last 168 hours. Coagulation Profile: No results for input(s): INR, PROTIME in the last 168 hours. Cardiac Enzymes: No results for input(s): CKTOTAL, CKMB, CKMBINDEX, TROPONINI in the last 168 hours. BNP (last 3 results) No results for input(s): PROBNP in the last 8760 hours. HbA1C:  Recent Labs  05/21/15 0432  HGBA1C 8.9*   CBG:  Recent Labs Lab 05/22/15 0800 05/22/15 1208 05/22/15 1615 05/22/15 2141 05/23/15 0755  GLUCAP 135* 234* 225* 241* 184*   Lipid Profile: No results for input(s): CHOL, HDL, LDLCALC, TRIG, CHOLHDL, LDLDIRECT in the last 72 hours. Thyroid Function Tests: No results for input(s): TSH, T4TOTAL, FREET4, T3FREE, THYROIDAB in the last 72 hours. Anemia Panel: No results for input(s): VITAMINB12, FOLATE, FERRITIN, TIBC, IRON, RETICCTPCT in the last 72 hours. Urine analysis:    Component  Value Date/Time   COLORURINE YELLOW 05/21/2015 0437   APPEARANCEUR CLOUDY* 05/21/2015 0437   LABSPEC 1.016 05/21/2015 0437   PHURINE 6.0 05/21/2015 0437   GLUCOSEU >1000* 05/21/2015 0437   HGBUR SMALL* 05/21/2015 0437   BILIRUBINUR NEGATIVE 05/21/2015 0437   KETONESUR 15* 05/21/2015 0437   PROTEINUR 100* 05/21/2015 0437   NITRITE POSITIVE* 05/21/2015 0437   LEUKOCYTESUR MODERATE* 05/21/2015 0437   Sepsis Labs: @LABRCNTIP (procalcitonin:4,lacticidven:4)  ) Recent Results (from the past 240 hour(s))  Blood Culture (routine x 2)     Status: None (Preliminary result)   Collection Time: 05/21/15  4:32 AM  Result Value Ref Range Status   Specimen Description BLOOD RIGHT ARM  Final   Special Requests BOTTLES DRAWN AEROBIC AND ANAEROBIC 5ML  Final   Culture NO GROWTH 1 DAY  Final   Report Status PENDING  Incomplete  Urine culture     Status: Abnormal   Collection Time: 05/21/15  4:37 AM  Result Value Ref Range Status   Specimen Description URINE, CLEAN CATCH  Final   Special Requests NONE  Final  Culture >=100,000 COLONIES/mL ESCHERICHIA COLI (A)  Final   Report Status 05/23/2015 FINAL  Final   Organism ID, Bacteria ESCHERICHIA COLI (A)  Final      Susceptibility   Escherichia coli - MIC*    AMPICILLIN >=32 RESISTANT Resistant     CEFAZOLIN <=4 SENSITIVE Sensitive     CEFTRIAXONE <=1 SENSITIVE Sensitive     CIPROFLOXACIN <=0.25 SENSITIVE Sensitive     GENTAMICIN <=1 SENSITIVE Sensitive     IMIPENEM <=0.25 SENSITIVE Sensitive     NITROFURANTOIN <=16 SENSITIVE Sensitive     TRIMETH/SULFA <=20 SENSITIVE Sensitive     AMPICILLIN/SULBACTAM 4 SENSITIVE Sensitive     PIP/TAZO <=4 SENSITIVE Sensitive     * >=100,000 COLONIES/mL ESCHERICHIA COLI  Blood Culture (routine x 2)     Status: Abnormal (Preliminary result)   Collection Time: 05/21/15  5:10 AM  Result Value Ref Range Status   Specimen Description BLOOD LEFT ARM  Final   Special Requests BOTTLES DRAWN AEROBIC AND ANAEROBIC   Final   Culture  Setup Time   Final    GRAM NEGATIVE RODS AEROBIC BOTTLE ONLY Organism ID to follow CRITICAL RESULT CALLED TO, READ BACK BY AND VERIFIED WITH: GREG ABBOTT @0303  05/22/15 MKELLY    Culture ESCHERICHIA COLI SUSCEPTIBILITIES TO FOLLOW  (A)  Final   Report Status PENDING  Incomplete  Blood Culture ID Panel (Reflexed)     Status: Abnormal   Collection Time: 05/21/15  5:10 AM  Result Value Ref Range Status   Enterococcus species NOT DETECTED NOT DETECTED Final   Vancomycin resistance NOT DETECTED NOT DETECTED Final   Listeria monocytogenes NOT DETECTED NOT DETECTED Final   Staphylococcus species NOT DETECTED NOT DETECTED Final   Staphylococcus aureus NOT DETECTED NOT DETECTED Final   Methicillin resistance NOT DETECTED NOT DETECTED Final   Streptococcus species NOT DETECTED NOT DETECTED Final   Streptococcus agalactiae NOT DETECTED NOT DETECTED Final   Streptococcus pneumoniae NOT DETECTED NOT DETECTED Final   Streptococcus pyogenes NOT DETECTED NOT DETECTED Final   Acinetobacter baumannii NOT DETECTED NOT DETECTED Final   Enterobacteriaceae species NOT DETECTED NOT DETECTED Final   Enterobacter cloacae complex NOT DETECTED NOT DETECTED Final   Escherichia coli DETECTED (A) NOT DETECTED Final    Comment: GREG ABBOTT @0303  05/22/15 MKELLY   Klebsiella oxytoca NOT DETECTED NOT DETECTED Final   Klebsiella pneumoniae NOT DETECTED NOT DETECTED Final   Proteus species NOT DETECTED NOT DETECTED Final   Serratia marcescens NOT DETECTED NOT DETECTED Final   Carbapenem resistance NOT DETECTED NOT DETECTED Final   Haemophilus influenzae NOT DETECTED NOT DETECTED Final   Neisseria meningitidis NOT DETECTED NOT DETECTED Final   Pseudomonas aeruginosa NOT DETECTED NOT DETECTED Final   Candida albicans NOT DETECTED NOT DETECTED Final   Candida glabrata NOT DETECTED NOT DETECTED Final   Candida krusei NOT DETECTED NOT DETECTED Final   Candida parapsilosis NOT DETECTED NOT DETECTED  Final   Candida tropicalis NOT DETECTED NOT DETECTED Final      Radiology Studies: No results found.   Scheduled Meds: . amLODipine  5 mg Oral Daily  . aspirin  325 mg Oral Daily  . cefTRIAXone (ROCEPHIN)  IV  2 g Intravenous Q24H  . docusate sodium  100 mg Oral BID  . enoxaparin (LOVENOX) injection  40 mg Subcutaneous Q24H  . glimepiride  2 mg Oral Q lunch  . insulin aspart  0-5 Units Subcutaneous QHS  . insulin aspart  0-9 Units Subcutaneous TID WC  .  iron polysaccharides  150 mg Oral BID  . latanoprost  1 drop Both Eyes QHS  . pantoprazole  40 mg Oral Daily  . polyethylene glycol  17 g Oral Daily   Continuous Infusions: . sodium chloride 50 mL/hr at 05/23/15 0154     LOS: 2 days   Time Spent in minutes   30 minutes  Adianna Darwin D.O. on 05/23/2015 at 11:23 AM  Between 7am to 7pm - Pager - [phone]  After 7pm go to [email or web address] - password [email or web address]  And look for the night coverage person covering for me after hours  [facility] Office  [phone]

## 2015-05-23 NOTE — Care Management Note (Addendum)
Case Management Note  Patient Details  Name: Jarvis Newcomerheo H Bignell MRN: [id number] Date of Birth: 1923/04/28  Subjective/Objective:                 Spoke to patient and daughter and son in law in room. Patient is from [facility]. She uses a cane and has a walker and wheelchair available to her if needed. Admitted for UTI, sepsis, tansient hypoxia, on IV Abx. Has very good social support, friends who eat every meal with her and "watch out" for one another. Family or transportation from [facility] take patient to appointments. Denies needing assistance with medications.    Action/Plan:  May need PT at DC, [facility]. Watch for O2 needs at DC.  Addendum 05-24-15 Faxed HH orders to York CeriseSandra Loy RN at [facility] [phone]. Pt on RA.   Expected Discharge Date:  05/24/15               Expected Discharge Plan:   ([facility])  In-House Referral:  Clinical Social Work  Discharge planning Services  CM Consult  Post Acute Care Choice:    Choice offered to:     DME Arranged:    DME Agency:     HH Arranged:    HH Agency:     Status of Service:  In process, will continue to follow  Medicare Important Message Given:  Yes Date Medicare IM Given:    Medicare IM give by:    Date Additional Medicare IM Given:    Additional Medicare Important Message give by:     If discussed at Long Length of Stay Meetings, dates discussed:    Additional Comments:  Lawerance SabalDebbie Ronneisha Jett, RN 05/23/2015, 12:28 PM

## 2015-05-24 LAB — CULTURE, BLOOD (ROUTINE X 2)

## 2015-05-24 LAB — GLUCOSE, CAPILLARY
GLUCOSE-CAPILLARY: 186 mg/dL — AB (ref 65–99)
Glucose-Capillary: 175 mg/dL — ABNORMAL HIGH (ref 65–99)

## 2015-05-24 MED ORDER — CEFUROXIME AXETIL 500 MG PO TABS: 500.0000 mg | ORAL_TABLET | Freq: Two times a day (BID) | ORAL | Status: AC

## 2015-05-24 NOTE — Discharge Instructions (Signed)

## 2015-05-24 NOTE — Discharge Summary (Signed)
Physician Discharge Summary  [name] ZOX:[id number] DOB: October 24, 1923 DOA: 05/21/2015  PCP: Minda Meo, MD  Admit date: 05/21/2015 Discharge date: 05/24/2015  Time spent: 45 minutes  Recommendations for Outpatient Follow-up:  Patient will be discharged to [facility] independent living with home health PT, OT, aide.  Patient will need to follow up with primary care provider within one week of discharge.  Patient should continue medications as prescribed.  Patient should follow a heart healthy/carb modifed diet.   Discharge Diagnoses:  Sepsis secondary to UTI (lower urinary tract infection)/Escherichia coli bacteremia Hypoventilation Headache Diabetes mellitus type 2, uncontrolled  Essential hypertension GERD (gastroesophageal reflux disease) Iron deficiency anemia Hypomagnesemia Hypokalemia Deconditioning   Discharge Condition: Stable  Diet recommendation: heart healthy/carb modified  Filed Weights   05/21/15 0413  Weight: 65.318 kg (144 lb)    History of present illness:  HPI on 05/21/2015 by Ms. Martrice Apt is a 82 y.o. female with medical history significant for diabetes on oral agents, hypertension, GERD and iron deficiency anemia. She presented to the ER because of fevers with chills and headache that had begun Monday evening. He is also been having low back pain with urinary frequency but no dysuria or foul-smelling urine. Because of the chills she presented to the ER. He denies history of recurrent headaches or headaches with fevers in the past. She denied any neurological symptoms such as visual disturbances or neurological signs.  Hospital Course:  Sepsis secondary to UTI (lower urinary tract infection)/Escherichia coli bacteremia -Patient's presenting symptoms consistent with urinary tract infection -Upon admission, patient was febrile, temperature 100.36F, heart rate greater than 90, lactic acid 2.39 -She has been afebrile for more than  48hours -Was placed on IV ceftriaxone -Currently afebrile, no leukocytosis -Blood (1/2) and urine cultures show Escherichia coli, pending sensitivities -Since patient recovered quickly, no need for repeat cultures (confirmed with Dr. Drue Second, ID, via phone) -Will discharge patient with Ceftin 500mg  BID for 10 days total of antibotics   Hypoventilation -Improved -Monitor for nocturnal hypoxemia; prn O2 ordered -Incentive spirometry -O2 sats on room air with ambulation 97%  Headache -Not associated with neurological signs and patient does not have any upper respiratory symptoms therefore do not suspect influenza -Symptomatic treatment  Diabetes mellitus type 2, uncontrolled  -Continue home Amaryl, metformin held- continue upon discharge  -Was placed on insulin sliding scale with CBG monitoring during hospitalization  -Hemoglobin A1c 8.9 -Patient will need to follow-up with her primary care physician on discharge for tighter and better hyperglycemic control  Essential hypertension -Continue Norvasc  GERD (gastroesophageal reflux disease) -Continue PPI  Iron deficiency anemia -Hemoglobin stable, currently 11.5 -Continue iron supplementation  Hypomagnesemia -Magnesium supplemented, only 2.1  Hypokalemia -Resolved, continue to monitor BMP  Deconditioning  -Patient does feel weak, resides at an independent living facility. -PT and OT consulted, recommended home health with aide  Consultants None  Procedures  None  Discharge Exam: Filed Vitals:   05/24/15 0600 05/24/15 0910  BP: 178/61 169/65  Pulse:    Temp:    Resp:      Exam  General: Well developed, elderly, NAD  HEENT: NCAT, mucous membranes moist.   Cardiovascular: S1 S2 auscultated, 2/6 SEM, RRR  Respiratory: Clear to auscultation bilaterally  Abdomen: Soft, nontender, nondistended, + bowel sounds  Extremities: warm dry without cyanosis clubbing or edema  Neuro: AAOx3, nonfocal  Psych: Normal  affect and demeanor, pleasant  Discharge Instructions     Medication List    TAKE these medications  amLODipine 5 MG tablet  Commonly known as:  NORVASC  Take 1 tablet by mouth daily.     aspirin 325 MG EC tablet  Take 325 mg by mouth daily.     cefUROXime 500 MG tablet  Commonly known as:  CEFTIN  Take 1 tablet (500 mg total) by mouth 2 (two) times daily with a meal.     CHLOR-TRIMETON ALLERGY PO  Take 1 tablet by mouth daily.     glimepiride 4 MG tablet  Commonly known as:  AMARYL  Take 4 mg by mouth 1 day or 1 dose. 1/2 tablet at noon     metFORMIN 500 MG 24 hr tablet  Commonly known as:  GLUCOPHAGE-XR  Take 500 mg by mouth 2 (two) times daily at 10 AM and 5 PM. 2 tablet in AM and 2 tablets PM     pantoprazole 40 MG tablet  Commonly known as:  PROTONIX  Take 40 mg by mouth daily.     polysaccharide iron 150 MG capsule  Generic drug:  iron polysaccharides  Take 150 mg by mouth 2 (two) times daily.     Travoprost (BAK Free) 0.004 % Soln ophthalmic solution  Commonly known as:  TRAVATAN  1 drop at bedtime. 1 drop at bedtime in left eye       Allergies  Allergen Reactions  . Nystatin Swelling   Follow-up Information    Follow up with ARONSON,RICHARD A, MD. Schedule an appointment as soon as possible for a visit in 1 week.   Specialty:  Internal Medicine   Why:  Hospital follow up   Contact information:   [address] [place] [address] [phone]        The results of significant diagnostics from this hospitalization (including imaging, microbiology, ancillary and laboratory) are listed below for reference.    Significant Diagnostic Studies: Dg Chest Port 1 View  05/21/2015  CLINICAL DATA:  Fever and constipation for 1 day. EXAM: PORTABLE CHEST 1 VIEW COMPARISON:  None. FINDINGS: Mild cardiac enlargement. Normal pulmonary vascularity. Shallow inspiration with mild linear atelectasis in the lung bases. No focal consolidation. No blunting  of costophrenic angles. No pneumothorax. Calcified and tortuous aorta. IMPRESSION: Shallow inspiration with atelectasis in the lung bases. Cardiac enlargement without vascular congestion. Electronically Signed   By: Burman Nieves M.D.   On: 05/21/2015 05:05    Microbiology: Recent Results (from the past 240 hour(s))  Blood Culture (routine x 2)     Status: None (Preliminary result)   Collection Time: 05/21/15  4:32 AM  Result Value Ref Range Status   Specimen Description BLOOD RIGHT ARM  Final   Special Requests BOTTLES DRAWN AEROBIC AND ANAEROBIC  Final   Culture NO GROWTH 2 DAYS  Final   Report Status PENDING  Incomplete  Urine culture     Status: Abnormal   Collection Time: 05/21/15  4:37 AM  Result Value Ref Range Status   Specimen Description URINE, CLEAN CATCH  Final   Special Requests NONE  Final   Culture >=100,000 COLONIES/mL ESCHERICHIA COLI (A)  Final   Report Status 05/23/2015 FINAL  Final   Organism ID, Bacteria ESCHERICHIA COLI (A)  Final      Susceptibility   Escherichia coli - MIC*    AMPICILLIN >=32 RESISTANT Resistant     CEFAZOLIN <=4 SENSITIVE Sensitive     CEFTRIAXONE <=1 SENSITIVE Sensitive     CIPROFLOXACIN <=0.25 SENSITIVE Sensitive     GENTAMICIN <=1 SENSITIVE Sensitive  IMIPENEM <=0.25 SENSITIVE Sensitive     NITROFURANTOIN <=16 SENSITIVE Sensitive     TRIMETH/SULFA <=20 SENSITIVE Sensitive     AMPICILLIN/SULBACTAM 4 SENSITIVE Sensitive     PIP/TAZO <=4 SENSITIVE Sensitive     * >=100,000 COLONIES/mL ESCHERICHIA COLI  Blood Culture (routine x 2)     Status: Abnormal   Collection Time: 05/21/15  5:10 AM  Result Value Ref Range Status   Specimen Description BLOOD LEFT ARM  Final   Special Requests BOTTLES DRAWN AEROBIC AND ANAEROBIC 5ML  Final   Culture  Setup Time   Final    GRAM NEGATIVE RODS AEROBIC BOTTLE ONLY Organism ID to follow CRITICAL RESULT CALLED TO, READ BACK BY AND VERIFIED WITH: GREG ABBOTT @0303  05/22/15 MKELLY    Culture  ESCHERICHIA COLI (A)  Final   Report Status 05/24/2015 FINAL  Final   Organism ID, Bacteria ESCHERICHIA COLI  Final      Susceptibility   Escherichia coli - MIC*    AMPICILLIN >=32 RESISTANT Resistant     CEFAZOLIN <=4 SENSITIVE Sensitive     CEFEPIME <=1 SENSITIVE Sensitive     CEFTAZIDIME <=1 SENSITIVE Sensitive     CEFTRIAXONE <=1 SENSITIVE Sensitive     CIPROFLOXACIN <=0.25 SENSITIVE Sensitive     GENTAMICIN <=1 SENSITIVE Sensitive     IMIPENEM <=0.25 SENSITIVE Sensitive     TRIMETH/SULFA <=20 SENSITIVE Sensitive     AMPICILLIN/SULBACTAM 8 SENSITIVE Sensitive     PIP/TAZO <=4 SENSITIVE Sensitive     * ESCHERICHIA COLI  Blood Culture ID Panel (Reflexed)     Status: Abnormal   Collection Time: 05/21/15  5:10 AM  Result Value Ref Range Status   Enterococcus species NOT DETECTED NOT DETECTED Final   Vancomycin resistance NOT DETECTED NOT DETECTED Final   Listeria monocytogenes NOT DETECTED NOT DETECTED Final   Staphylococcus species NOT DETECTED NOT DETECTED Final   Staphylococcus aureus NOT DETECTED NOT DETECTED Final   Methicillin resistance NOT DETECTED NOT DETECTED Final   Streptococcus species NOT DETECTED NOT DETECTED Final   Streptococcus agalactiae NOT DETECTED NOT DETECTED Final   Streptococcus pneumoniae NOT DETECTED NOT DETECTED Final   Streptococcus pyogenes NOT DETECTED NOT DETECTED Final   Acinetobacter baumannii NOT DETECTED NOT DETECTED Final   Enterobacteriaceae species NOT DETECTED NOT DETECTED Final   Enterobacter cloacae complex NOT DETECTED NOT DETECTED Final   Escherichia coli DETECTED (A) NOT DETECTED Final    Comment: GREG ABBOTT @0303  05/22/15 MKELLY   Klebsiella oxytoca NOT DETECTED NOT DETECTED Final   Klebsiella pneumoniae NOT DETECTED NOT DETECTED Final   Proteus species NOT DETECTED NOT DETECTED Final   Serratia marcescens NOT DETECTED NOT DETECTED Final   Carbapenem resistance NOT DETECTED NOT DETECTED Final   Haemophilus influenzae NOT DETECTED  NOT DETECTED Final   Neisseria meningitidis NOT DETECTED NOT DETECTED Final   Pseudomonas aeruginosa NOT DETECTED NOT DETECTED Final   Candida albicans NOT DETECTED NOT DETECTED Final   Candida glabrata NOT DETECTED NOT DETECTED Final   Candida krusei NOT DETECTED NOT DETECTED Final   Candida parapsilosis NOT DETECTED NOT DETECTED Final   Candida tropicalis NOT DETECTED NOT DETECTED Final     Labs: Basic Metabolic Panel:  Recent Labs Lab 05/21/15 0432 05/22/15 0448 05/23/15 0439  NA 136 140 137  K 3.9 3.1* 3.9  CL 101 107 104  CO2 19* 23 22  GLUCOSE 296* 159* 189*  BUN 14 11 9   CREATININE 0.92 0.81 0.70  CALCIUM 9.5 8.5* 8.7*  MG 1.3* 2.1  --    Liver Function Tests:  Recent Labs Lab 05/21/15 0432  AST 19  ALT 9*  ALKPHOS 70  BILITOT 1.0  PROT 5.6*  ALBUMIN 3.2*   No results for input(s): LIPASE, AMYLASE in the last 168 hours. No results for input(s): AMMONIA in the last 168 hours. CBC:  Recent Labs Lab 05/21/15 0432 05/22/15 0448 05/23/15 0439  WBC 9.9 9.3 6.1  NEUTROABS 9.2*  --   --   HGB 11.9* 11.1* 11.5*  HCT 37.7 34.7* 36.2  MCV 80.7 80.5 80.4  PLT 209 215 228   Cardiac Enzymes: No results for input(s): CKTOTAL, CKMB, CKMBINDEX, TROPONINI in the last 168 hours. BNP: BNP (last 3 results) No results for input(s): BNP in the last 8760 hours.  ProBNP (last 3 results) No results for input(s): PROBNP in the last 8760 hours.  CBG:  Recent Labs Lab 05/23/15 0755 05/23/15 1156 05/23/15 1801 05/23/15 2050 05/24/15 0801  GLUCAP 184* 259* 182* 211* 175*       Signed:  Edsel Petrin  [facility] 05/24/2015, 11:48 AM

## 2015-05-24 NOTE — Progress Notes (Signed)
[name] to be D/C'd to independent living at [facility] per MD order.  Discussed with the patient and all questions fully answered.  VSS, Skin clean, dry and intact without evidence of skin break down, no evidence of skin tears noted. IV catheter discontinued intact. Site without signs and symptoms of complications. Dressing and pressure applied.  An After Visit Summary was printed and given to the patient. Patient received prescription.  D/c education completed with patient/family including follow up instructions, medication list, d/c activities limitations if indicated, with other d/c instructions as indicated by MD - patient able to verbalize understanding, all questions fully answered.   Patient instructed to return to ED, call 911, or call MD for any changes in condition.   Patient escorted via WC, and D/C to independent living via private auto.  [name] 05/24/2015 3:52 PM

## 2015-05-24 NOTE — Care Management Important Message (Signed)
  Important Message  Patient Details  Name: [name] MRN: [id number] Date of Birth: Dec 08, 1923   Medicare Important Message Given:  Yes    Lawerance Sabalebbie Yuepheng Schaller, RN 05/24/2015, 11:24 AMImportant Message  Patient Details  Name: [name] MRN: [id number] Date of Birth: Dec 08, 1923   Medicare Important Message Given:  Yes    Lawerance Sabalebbie Veronique Warga, RN 05/24/2015, 11:24 AM

## 2015-05-24 NOTE — Evaluation (Signed)
Occupational Therapy Evaluation Patient Details Name: [name] MRN: [id number] DOB: 1923-02-22 Today's Date: 05/24/2015    History of Present Illness Pt is an 82 yo female admitted with headache, chills, fever. Pt with UTI.     Clinical Impression   Pt admitted with the above diagnosis and has the deficits listed below. Pt would benefit from cont OT to increase independence with basic adls and adl transfers to mod I level of care so she will be independent d/cing home to her independent living apartment.  Feel pt should be safe back in independent living with HHOT.    Follow Up Recommendations  Home health OT;Other (comment);Supervision - Intermittent (HHAid just for first week or two to transition.)    Equipment Recommendations  None recommended by OT    Recommendations for Other Services       Precautions / Restrictions Precautions Precautions: Fall Precaution Comments: pt with macular degeneration.  Mobilizes better in familar environments per daughter. Restrictions Weight Bearing Restrictions: No      Mobility Bed Mobility Overal bed mobility: Modified Independent                Transfers Overall transfer level: Needs assistance Equipment used: Straight cane Transfers: Sit to/from Stand Sit to Stand: Supervision         General transfer comment: Pt stood several times from commode and from bed and chair with S.    Balance Overall balance assessment: Needs assistance Sitting-balance support: Feet supported Sitting balance-Leahy Scale: Good     Standing balance support: Bilateral upper extremity supported;During functional activity Standing balance-Leahy Scale: Fair Standing balance comment: Pt did appear to be more stable when walking with the walker or cane as opposed to nothing.                            ADL Overall ADL's : Needs assistance/impaired Eating/Feeding: Set up;Sitting Eating/Feeding Details (indicate cue type and  reason): cues for vision Grooming: Oral care;Wash/dry face;Wash/dry hands;Supervision/safety;Standing   Upper Body Bathing: Set up;Sitting   Lower Body Bathing: Min guard;Sit to/from stand   Upper Body Dressing : Set up;Sitting   Lower Body Dressing: Min guard;Sit to/from stand   Toilet Transfer: Supervision/safety;Ambulation   Toileting- Clothing Manipulation and Hygiene: Supervision/safety;Sit to/from stand   Tub/ Shower Transfer: Min guard;Ambulation;Grab bars;Shower seat;Walk-in shower   Functional mobility during ADLs: Min guard General ADL Comments: Pt close to baseline with adls.  Pt unsteady at times on feet possibly due to macular degeneration.  Pt with home apartment set up just where she knows everything is to compensate for her visual deficits.  Pt seems to mobilize better in familiar environments per daughter which makes sense.  Feel pt may benefit from a home health aid just for first week or two to transition back home safelty.     Vision Vision Assessment?: Vision impaired- to be further tested in functional context Additional Comments: Pt's macular degeneration is long standing.  Pt feels it has not changed.   Perception     Praxis      Pertinent Vitals/Pain Pain Assessment: 0-10 Pain Score: 2  Pain Location: head Pain Descriptors / Indicators: Aching Pain Intervention(s): Monitored during session     Hand Dominance Right   Extremity/Trunk Assessment Upper Extremity Assessment Upper Extremity Assessment: Overall WFL for tasks assessed   Lower Extremity Assessment Lower Extremity Assessment: Defer to PT evaluation   Cervical / Trunk Assessment Cervical / Trunk Assessment:  Kyphotic   Communication Communication Communication: HOH   Cognition Arousal/Alertness: Awake/alert Behavior During Therapy: WFL for tasks assessed/performed Overall Cognitive Status: Within Functional Limits for tasks assessed                     General Comments        Exercises       Shoulder Instructions      Home Living Family/patient expects to be discharged to:: Other (Comment) ([facility] independent living) Living Arrangements: Alone Available Help at Discharge: Family;Available PRN/intermittently Type of Home: Independent living facility Home Access: Level entry     Home Layout: One level     Bathroom Shower/Tub: Walk-in shower;Door   Bathroom Toilet: Handicapped height     Home Equipment: Environmental consultant - 4 wheels;Cane - single point;Shower seat - built in;Wheelchair - manual   Additional Comments: Pt uses cane or nothing inside and uses walker outside.      Prior Functioning/Environment Level of Independence: Independent with assistive device(s)        Comments: Pt uses AD to walk at times, a shower seat to bathe.      OT Diagnosis: Generalized weakness;Acute pain   OT Problem List: Impaired balance (sitting and/or standing);Decreased knowledge of use of DME or AE;Pain   OT Treatment/Interventions: Self-care/ADL training;Therapeutic activities;DME and/or AE instruction;Balance training    OT Goals(Current goals can be found in the care plan section) Acute Rehab OT Goals Patient Stated Goal: to go home today. OT Goal Formulation: With patient/family Time For Goal Achievement: 05/31/15 Potential to Achieve Goals: Good ADL Goals Pt Will Perform Grooming: with modified independence;standing Pt Will Perform Lower Body Bathing: with modified independence;sit to/from stand Pt Will Perform Lower Body Dressing: with modified independence;sit to/from stand Pt Will Perform Tub/Shower Transfer: with modified independence;shower seat;ambulating;rolling walker;Shower transfer Additional ADL Goal #1: Pt  will walk to bathroom and toilet on comfort commode with rails with mod I.  OT Frequency: Min 2X/week   Barriers to D/C:            Co-evaluation              End of Session Equipment Utilized During Treatment: Rolling  walker Nurse Communication: Mobility status  Activity Tolerance: Patient tolerated treatment well Patient left: in chair;with call bell/phone within reach;with family/visitor present   Time: 0932-1003 OT Time Calculation (min): 31 min Charges:  OT General Charges $OT Visit: 1 Procedure OT Evaluation $OT Eval Moderate Complexity: 1 Procedure OT Treatments $Self Care/Home Management : 8-22 mins G-Codes:    Hope Budds 2015-06-01, 10:20 AM  [phone]

## 2015-05-24 NOTE — Evaluation (Signed)
Physical Therapy Evaluation Patient Details Name: [name] MRN: [id number] DOB: [birth year] Today's Date: 05/24/2015   History of Present Illness  Pt is an 82 yo female admitted with headache, chills, fever. Pt with UTI.    Clinical Impression  Pt is getting up to walk with RW and has a limited vision on L eye, but also some minor coordination issues due to vision.  Her plan is to go home with HHPT and has access to staff to assist if needed, can be arranged per her daughter.  Will continue gait and strengthening with PT in hosp until pt is discharged.    Follow Up Recommendations Home health PT    Equipment Recommendations  None recommended by PT    Recommendations for Other Services Rehab consult     Precautions / Restrictions Precautions Precautions: Fall Precaution Comments: pt with macular degeneration.  Mobilizes better in familar environments per daughter. Restrictions Weight Bearing Restrictions: No Other Position/Activity Restrictions: L side vision low and needs verbal cues for avoiding obstacles in unfamililar environment      Mobility  Bed Mobility Overal bed mobility: Modified Independent                Transfers Overall transfer level: Needs assistance Equipment used: Rolling walker (2 wheeled) Transfers: Sit to/from BJ'sStand;Stand Pivot Transfers Sit to Stand: Supervision Stand pivot transfers: Supervision;Min guard       General transfer comment: Pt stood several times from commode and from bed and chair with S.  Ambulation/Gait Ambulation/Gait assistance: Supervision;Min guard Ambulation Distance (Feet): 150 Feet Assistive device: Rolling walker (2 wheeled) Gait Pattern/deviations: Step-through pattern;Decreased stride length;Wide base of support;Drifts right/left (tendency to run into objects but hallway is cluttered) Gait velocity: reduced Gait velocity interpretation: Below normal speed for age/gender    Stairs             Wheelchair Mobility    Modified Rankin (Stroke Patients Only)       Balance Overall balance assessment: Needs assistance Sitting-balance support: Feet supported Sitting balance-Leahy Scale: Good   Postural control: Posterior lean Standing balance support: Bilateral upper extremity supported Standing balance-Leahy Scale: Fair Standing balance comment: Pt did appear to be more stable when walking with the walker or cane as opposed to nothing.                             Pertinent Vitals/Pain Pain Assessment: No/denies pain Pain Score: 2  Pain Location: head Pain Descriptors / Indicators: Aching Pain Intervention(s): Monitored during session    Home Living Family/patient expects to be discharged to:: Other (Comment) [facility] Living Arrangements: Alone Available Help at Discharge: Family;Available PRN/intermittently Type of Home: Independent living facility Home Access: Level entry     Home Layout: One level Home Equipment: Walker - 4 wheels;Cane - single point;Shower seat - built in;Wheelchair - manual Additional Comments: Pt uses cane or nothing inside and uses walker outside.    Prior Function Level of Independence: Independent with assistive device(s)         Comments: Pt uses AD to walk at times, a shower seat to bathe.       Hand Dominance   Dominant Hand: Right    Extremity/Trunk Assessment   Upper Extremity Assessment: Overall WFL for tasks assessed           Lower Extremity Assessment: Overall WFL for tasks assessed      Cervical / Trunk Assessment: Kyphotic  Communication   Communication: HOH  Cognition Arousal/Alertness: Awake/alert Behavior During Therapy: WFL for tasks assessed/performed Overall Cognitive Status: Within Functional Limits for tasks assessed                      General Comments General comments (skin integrity, edema, etc.): monitored vitals with therapy and has pulse of 86 pregait and  91 post, O2 sats 97% pre and post    Exercises        Assessment/Plan    PT Assessment Patient needs continued PT services  PT Diagnosis Difficulty walking   PT Problem List Decreased range of motion;Decreased balance;Decreased activity tolerance;Decreased mobility;Decreased coordination;Cardiopulmonary status limiting activity  PT Treatment Interventions DME instruction;Gait training;Functional mobility training;Therapeutic activities;Therapeutic exercise;Balance training;Neuromuscular re-education;Patient/family education   PT Goals (Current goals can be found in the Care Plan section) Acute Rehab PT Goals Patient Stated Goal: to go home today. PT Goal Formulation: With patient/family Time For Goal Achievement: 06/07/15 Potential to Achieve Goals: Good    Frequency Min 3X/week   Barriers to discharge Decreased caregiver support home in ILF with no assist most of the time    Co-evaluation               End of Session Equipment Utilized During Treatment: Gait belt Activity Tolerance: Patient tolerated treatment well;Patient limited by fatigue Patient left: in bed (sitting bedside with OT) Nurse Communication: Mobility status         Time: 4098-1191 PT Time Calculation (min) (ACUTE ONLY): 29 min   Charges:   PT Evaluation $PT Eval Low Complexity: 1 Procedure PT Treatments $Gait Training: 8-22 mins   PT G CodesIvar Drape 26-May-2015, 12:47 PM    Samul Dada, PT MS Acute Rehab Dept. Number: [facility] [id number] and [facility] [phone]

## 2015-05-26 LAB — CULTURE, BLOOD (ROUTINE X 2): Culture: NO GROWTH

## 2015-06-09 ENCOUNTER — Inpatient Hospital Stay (HOSPITAL_COMMUNITY): Payer: Medicare Other

## 2015-06-09 ENCOUNTER — Emergency Department (HOSPITAL_COMMUNITY): Payer: Medicare Other

## 2015-06-09 ENCOUNTER — Inpatient Hospital Stay (HOSPITAL_COMMUNITY)
Admission: EM | Admit: 2015-06-09 | Discharge: 2015-07-06 | DRG: 871 | Disposition: E | Payer: Medicare Other | Attending: Pulmonary Disease | Admitting: Pulmonary Disease

## 2015-06-09 DIAGNOSIS — K219 Gastro-esophageal reflux disease without esophagitis: Secondary | ICD-10-CM | POA: Diagnosis present

## 2015-06-09 DIAGNOSIS — Z86718 Personal history of other venous thrombosis and embolism: Secondary | ICD-10-CM | POA: Diagnosis not present

## 2015-06-09 DIAGNOSIS — R6521 Severe sepsis with septic shock: Secondary | ICD-10-CM | POA: Diagnosis present

## 2015-06-09 DIAGNOSIS — Z7982 Long term (current) use of aspirin: Secondary | ICD-10-CM

## 2015-06-09 DIAGNOSIS — E119 Type 2 diabetes mellitus without complications: Secondary | ICD-10-CM | POA: Diagnosis present

## 2015-06-09 DIAGNOSIS — J9691 Respiratory failure, unspecified with hypoxia: Secondary | ICD-10-CM | POA: Diagnosis present

## 2015-06-09 DIAGNOSIS — Z7984 Long term (current) use of oral hypoglycemic drugs: Secondary | ICD-10-CM

## 2015-06-09 DIAGNOSIS — Z515 Encounter for palliative care: Secondary | ICD-10-CM | POA: Diagnosis present

## 2015-06-09 DIAGNOSIS — Z888 Allergy status to other drugs, medicaments and biological substances status: Secondary | ICD-10-CM

## 2015-06-09 DIAGNOSIS — N179 Acute kidney failure, unspecified: Secondary | ICD-10-CM | POA: Diagnosis present

## 2015-06-09 DIAGNOSIS — M810 Age-related osteoporosis without current pathological fracture: Secondary | ICD-10-CM | POA: Diagnosis present

## 2015-06-09 DIAGNOSIS — Z66 Do not resuscitate: Secondary | ICD-10-CM | POA: Diagnosis present

## 2015-06-09 DIAGNOSIS — N390 Urinary tract infection, site not specified: Secondary | ICD-10-CM | POA: Diagnosis present

## 2015-06-09 DIAGNOSIS — Z79899 Other long term (current) drug therapy: Secondary | ICD-10-CM | POA: Diagnosis not present

## 2015-06-09 DIAGNOSIS — Z87440 Personal history of urinary (tract) infections: Secondary | ICD-10-CM

## 2015-06-09 DIAGNOSIS — D649 Anemia, unspecified: Secondary | ICD-10-CM | POA: Diagnosis present

## 2015-06-09 DIAGNOSIS — A419 Sepsis, unspecified organism: Principal | ICD-10-CM | POA: Diagnosis present

## 2015-06-09 LAB — CBC WITH DIFFERENTIAL/PLATELET
BAND NEUTROPHILS: 32 %
BASOS ABS: 0 10*3/uL (ref 0.0–0.1)
BASOS PCT: 0 %
BLASTS: 0 %
Basophils Absolute: 0 10*3/uL (ref 0.0–0.1)
Basophils Relative: 0 %
EOS ABS: 0 10*3/uL (ref 0.0–0.7)
EOS ABS: 0 10*3/uL (ref 0.0–0.7)
Eosinophils Relative: 0 %
Eosinophils Relative: 0 %
HCT: 33.7 % — ABNORMAL LOW (ref 36.0–46.0)
HCT: 33.7 % — ABNORMAL LOW (ref 36.0–46.0)
Hemoglobin: 10.6 g/dL — ABNORMAL LOW (ref 12.0–15.0)
Hemoglobin: 10.4 g/dL — ABNORMAL LOW (ref 12.0–15.0)
Lymphocytes Relative: 13 %
Lymphocytes Relative: 5 %
Lymphs Abs: 1.2 10*3/uL (ref 0.7–4.0)
Lymphs Abs: 0.7 10*3/uL (ref 0.7–4.0)
MCH: 24.1 pg — AB (ref 26.0–34.0)
MCH: 23.7 pg — AB (ref 26.0–34.0)
MCHC: 31.5 g/dL (ref 30.0–36.0)
MCHC: 30.9 g/dL (ref 30.0–36.0)
MCV: 76.8 fL — ABNORMAL LOW (ref 78.0–100.0)
MCV: 76.8 fL — AB (ref 78.0–100.0)
METAMYELOCYTES PCT: 5 %
MONO ABS: 0.4 10*3/uL (ref 0.1–1.0)
Monocytes Absolute: 0.5 10*3/uL (ref 0.1–1.0)
Monocytes Relative: 5 %
Monocytes Relative: 3 %
Myelocytes: 1 %
NEUTROS ABS: 12.9 10*3/uL — AB (ref 1.7–7.7)
Neutro Abs: 7.7 10*3/uL (ref 1.7–7.7)
Neutrophils Relative %: 44 %
Neutrophils Relative %: 92 %
Other: 0 %
PLATELETS: 198 10*3/uL (ref 150–400)
PLATELETS: 207 10*3/uL (ref 150–400)
PROMYELOCYTES ABS: 0 %
RBC: 4.39 MIL/uL (ref 3.87–5.11)
RBC: 4.39 MIL/uL (ref 3.87–5.11)
RDW: 15.1 % (ref 11.5–15.5)
RDW: 15.2 % (ref 11.5–15.5)
WBC MORPHOLOGY: INCREASED
WBC Morphology: INCREASED
WBC: 9.4 10*3/uL (ref 4.0–10.5)
WBC: 14 10*3/uL — ABNORMAL HIGH (ref 4.0–10.5)
nRBC: 1 /100 WBC — ABNORMAL HIGH

## 2015-06-09 LAB — LIPASE, BLOOD
LIPASE: 22 U/L (ref 11–51)
LIPASE: 16 U/L (ref 11–51)

## 2015-06-09 LAB — I-STAT CG4 LACTIC ACID, ED
LACTIC ACID, VENOUS: 8.52 mmol/L — AB (ref 0.5–2.0)
LACTIC ACID, VENOUS: 5.33 mmol/L — AB (ref 0.5–2.0)

## 2015-06-09 LAB — COMPREHENSIVE METABOLIC PANEL
ALBUMIN: 2.1 g/dL — AB (ref 3.5–5.0)
ALK PHOS: 229 U/L — AB (ref 38–126)
ALT: 89 U/L — AB (ref 14–54)
AST: 157 U/L — AB (ref 15–41)
Albumin: 2.5 g/dL — ABNORMAL LOW (ref 3.5–5.0)
Alkaline Phosphatase: 268 U/L — ABNORMAL HIGH (ref 38–126)
Anion gap: 15 (ref 5–15)
Anion gap: 10 (ref 5–15)
BILIRUBIN TOTAL: 0.6 mg/dL (ref 0.3–1.2)
BUN: 27 mg/dL — AB (ref 6–20)
BUN: 28 mg/dL — AB (ref 6–20)
CALCIUM: 9.7 mg/dL (ref 8.9–10.3)
CALCIUM: 9 mg/dL (ref 8.9–10.3)
CO2: 18 mmol/L — ABNORMAL LOW (ref 22–32)
CO2: 19 mmol/L — AB (ref 22–32)
CREATININE: 2.22 mg/dL — AB (ref 0.44–1.00)
CREATININE: 2.08 mg/dL — AB (ref 0.44–1.00)
Chloride: 101 mmol/L (ref 101–111)
Chloride: 106 mmol/L (ref 101–111)
GFR calc Af Amer: 21 mL/min — ABNORMAL LOW (ref >60)
GFR calc Af Amer: 23 mL/min — ABNORMAL LOW (ref >60)
GFR calc non Af Amer: 20 mL/min — ABNORMAL LOW (ref >60)
GFR, EST NON AFRICAN AMERICAN: 18 mL/min — AB (ref >60)
GLUCOSE: 200 mg/dL — AB (ref 65–99)
Glucose, Bld: 212 mg/dL — ABNORMAL HIGH (ref 65–99)
Potassium: 3.3 mmol/L — ABNORMAL LOW (ref 3.5–5.1)
Potassium: 3.3 mmol/L — ABNORMAL LOW (ref 3.5–5.1)
SODIUM: 135 mmol/L (ref 135–145)
Sodium: 134 mmol/L — ABNORMAL LOW (ref 135–145)
TOTAL PROTEIN: 5.4 g/dL — AB (ref 6.5–8.1)
Total Bilirubin: 1.2 mg/dL (ref 0.3–1.2)
Total Protein: 5 g/dL — ABNORMAL LOW (ref 6.5–8.1)

## 2015-06-09 LAB — URINALYSIS, ROUTINE W REFLEX MICROSCOPIC
Bilirubin Urine: NEGATIVE
Glucose, UA: 500 mg/dL — AB
KETONES UR: NEGATIVE mg/dL
Nitrite: NEGATIVE
PROTEIN: 100 mg/dL — AB
Specific Gravity, Urine: 1.019 (ref 1.005–1.030)
pH: 5 (ref 5.0–8.0)

## 2015-06-09 LAB — PROCALCITONIN: Procalcitonin: >175 ng/mL

## 2015-06-09 LAB — BRAIN NATRIURETIC PEPTIDE: B Natriuretic Peptide: 2523.7 pg/mL — ABNORMAL HIGH (ref 0.0–100.0)

## 2015-06-09 LAB — I-STAT CHEM 8, ED
BUN: 26 mg/dL — ABNORMAL HIGH (ref 6–20)
CHLORIDE: 100 mmol/L — AB (ref 101–111)
CREATININE: 2 mg/dL — AB (ref 0.44–1.00)
Calcium, Ion: 1.33 mmol/L — ABNORMAL HIGH (ref 1.13–1.30)
GLUCOSE: 203 mg/dL — AB (ref 65–99)
HCT: 33 % — ABNORMAL LOW (ref 36.0–46.0)
Hemoglobin: 11.2 g/dL — ABNORMAL LOW (ref 12.0–15.0)
POTASSIUM: 3.4 mmol/L — AB (ref 3.5–5.1)
Sodium: 136 mmol/L (ref 135–145)
TCO2: 18 mmol/L (ref 0–100)

## 2015-06-09 LAB — URINE MICROSCOPIC-ADD ON

## 2015-06-09 LAB — TYPE AND SCREEN
ABO/RH(D): A POS
Antibody Screen: NEGATIVE

## 2015-06-09 LAB — POC OCCULT BLOOD, ED: FECAL OCCULT BLD: POSITIVE — AB

## 2015-06-09 LAB — TROPONIN I
Troponin I: 1.91 ng/mL (ref <0.031)
Troponin I: 1.4 ng/mL (ref <0.031)

## 2015-06-09 LAB — LACTIC ACID, PLASMA: LACTIC ACID, VENOUS: 4.8 mmol/L — AB (ref 0.5–2.0)

## 2015-06-09 LAB — CORTISOL: Cortisol, Plasma: 80.9 ug/dL

## 2015-06-09 LAB — CBG MONITORING, ED: Glucose-Capillary: 216 mg/dL — ABNORMAL HIGH (ref 65–99)

## 2015-06-09 LAB — GLUCOSE, CAPILLARY: Glucose-Capillary: 225 mg/dL — ABNORMAL HIGH (ref 65–99)

## 2015-06-09 LAB — MRSA PCR SCREENING: MRSA BY PCR: NEGATIVE

## 2015-06-09 LAB — AMYLASE: AMYLASE: 25 U/L — AB (ref 28–100)

## 2015-06-09 MED ORDER — SODIUM CHLORIDE 0.9 % IV BOLUS (SEPSIS)
1000.0000 mL | Freq: Once | INTRAVENOUS | Status: AC
  Administered 2015-06-09: 1000 mL via INTRAVENOUS

## 2015-06-09 MED ORDER — ONDANSETRON HCL 4 MG/2ML IJ SOLN
4.0000 mg | Freq: Once | INTRAMUSCULAR | Status: AC
Start: 2015-06-09 — End: 2015-06-09
  Administered 2015-06-09: 4 mg via INTRAVENOUS
  Filled 2015-06-09: qty 2

## 2015-06-09 MED ORDER — MORPHINE BOLUS VIA INFUSION
5.0000 mg | INTRAVENOUS | Status: DC | PRN
  Filled 2015-06-09: qty 20

## 2015-06-09 MED ORDER — PHENYLEPHRINE HCL 10 MG/ML IJ SOLN
30.0000 ug/min | INTRAMUSCULAR | Status: DC
  Administered 2015-06-09 (×5): 200 ug/min via INTRAVENOUS
  Administered 2015-06-09: 175 ug/min via INTRAVENOUS
  Administered 2015-06-09 (×3): 200 ug/min via INTRAVENOUS
  Filled 2015-06-09 (×11): qty 1

## 2015-06-09 MED ORDER — HEPARIN SODIUM (PORCINE) 5000 UNIT/ML IJ SOLN
5000.0000 [IU] | Freq: Three times a day (TID) | INTRAMUSCULAR | Status: DC
  Administered 2015-06-09: 5000 [IU] via SUBCUTANEOUS
  Filled 2015-06-09 (×3): qty 1

## 2015-06-09 MED ORDER — VANCOMYCIN HCL IN DEXTROSE 1-5 GM/200ML-% IV SOLN
1000.0000 mg | Freq: Once | INTRAVENOUS | Status: AC
  Administered 2015-06-09: 1000 mg via INTRAVENOUS
  Filled 2015-06-09: qty 200

## 2015-06-09 MED ORDER — SODIUM CHLORIDE 0.9 % IV SOLN: INTRAVENOUS | Status: DC

## 2015-06-09 MED ORDER — MIDAZOLAM HCL 2 MG/2ML IJ SOLN
1.0000 mg | INTRAMUSCULAR | Status: DC | PRN
  Administered 2015-06-09: 1 mg via INTRAVENOUS
  Filled 2015-06-09: qty 2

## 2015-06-09 MED ORDER — SODIUM CHLORIDE 0.9 % IV SOLN
INTRAVENOUS | Status: DC
Start: 2015-06-09 — End: 2015-06-09
  Administered 2015-06-09: 75 mL/h via INTRAVENOUS

## 2015-06-09 MED ORDER — ACETAMINOPHEN 325 MG PO TABS
650.0000 mg | ORAL_TABLET | Freq: Once | ORAL | Status: AC
  Administered 2015-06-09: 650 mg via ORAL
  Filled 2015-06-09: qty 2

## 2015-06-09 MED ORDER — PIPERACILLIN-TAZOBACTAM IN DEX 2-0.25 GM/50ML IV SOLN
2.2500 g | Freq: Three times a day (TID) | INTRAVENOUS | Status: DC
  Administered 2015-06-09: 2.25 g via INTRAVENOUS
  Filled 2015-06-09 (×3): qty 50

## 2015-06-09 MED ORDER — DEXTROSE 5 % IV SOLN
0.0000 ug/min | Freq: Once | INTRAVENOUS | Status: AC
  Administered 2015-06-09: 20 ug/min via INTRAVENOUS
  Filled 2015-06-09: qty 1

## 2015-06-09 MED ORDER — PIPERACILLIN-TAZOBACTAM 3.375 G IVPB 30 MIN
3.3750 g | Freq: Once | INTRAVENOUS | Status: AC
  Administered 2015-06-09: 3.375 g via INTRAVENOUS
  Filled 2015-06-09: qty 50

## 2015-06-09 MED ORDER — SODIUM CHLORIDE 0.9 % IV SOLN: 250.0000 mL | INTRAVENOUS | Status: DC | PRN

## 2015-06-09 MED ORDER — MORPHINE SULFATE 25 MG/ML IV SOLN
10.0000 mg/h | INTRAVENOUS | Status: DC
  Administered 2015-06-09: 10 mg/h via INTRAVENOUS
  Filled 2015-06-09: qty 10

## 2015-06-09 MED ORDER — DIATRIZOATE MEGLUMINE & SODIUM 66-10 % PO SOLN
ORAL | Status: AC
  Filled 2015-06-09: qty 30

## 2015-06-09 MED ORDER — VANCOMYCIN HCL IN DEXTROSE 1-5 GM/200ML-% IV SOLN: 1000.0000 mg | INTRAVENOUS | Status: DC

## 2015-06-09 NOTE — ED Notes (Signed)
PT states increasing sob after 1500 L bolsu - had already been placed on 2L [place] for labored breathing.  Fluids stopped and pressures continue to remain in the 90's.  Pt states she does not want a central line.  Dr Criss AlvineGoldston speaking with critical care.

## 2015-06-09 NOTE — ED Notes (Signed)
[facility] for [facility].  Pt states she stood up to get out of bed and slid down, landing on her bottom.  Security assisted pt to bed and took vs (100/60  Laying, 90/50 standing, hr 111) cbg 311.  Pt recently started on stool softener and was up to the bathroom several times during night for bm's. Abd distended.  What appears to be dry, dark blood on mouth.

## 2015-06-09 NOTE — Progress Notes (Signed)
Pharmacy Antibiotic Note  [name] is a 82 y.o. female admitted on 07/02/2015 with sepsis.  Pharmacy has been consulted for Zosyn and vancomycin dosing.  Day #1 of abx for sepsis. One time doses ordered in the ED. Tmax of 101.1, WBC wnl. SCr elevated at 2.0, CrCl ~15-6620ml/min  Plan: Start vancomycin 1g IV Q48 Start Zosyn 2.25g IV Q8 Monitor clinical picture, renal function, VT prn F/U C&S, abx deescalation / LOT      Temp (24hrs), Avg:99.7 F (37.6 C), Min:98.3 F (36.8 C), Max:101.1 F (38.4 C)   Recent Labs Lab 06/08/2015 0852 06/07/2015 0908 06/11/2015 0919  WBC 9.4  --   --   CREATININE  --   --  2.00*  LATICACIDVEN  --  8.52*  --     CrCl cannot be calculated (Unknown ideal weight.).    Allergies  Allergen Reactions  . Nystatin Swelling    Antimicrobials this admission: Zosyn 6/4 >>  Vancomycin 6/4 >>   Dose adjustments this admission: n/a  Microbiology results: 6/4 BCx: sent 6/4 UCx: sent   Thank you for allowing pharmacy to be a part of this patient's care.  [name],[name] 06/07/2015 9:25 AM

## 2015-06-09 NOTE — Progress Notes (Signed)
Pt seems comfortable. Mental status intact, still drowsy. Family expressed the words of patient" I do not want to have even BP med to keep me going, I am ready" MD aware, waiting on son coming from other city to make a decision about posibility of comfort care.

## 2015-06-09 NOTE — Progress Notes (Addendum)
Pt arrived on the unit. Drowsy, but oriented. Clearly expressed she "does not want any extra measures to keep her alive of something happens" Family supporting pt's wishes. Neo rate titrated up and at max at this time. Troponin critical 1.91 was called and MD aware. MD at the bedside. Going to speak to family when everyone is arrive.

## 2015-06-09 NOTE — ED Notes (Signed)
Pharmacy called for phenylephrine.  Stated med is on it's way.

## 2015-06-09 NOTE — ED Notes (Signed)
Called CT.  Pt completed 1 bottle of fluid.

## 2015-06-09 NOTE — H&P (Signed)
PULMONARY / CRITICAL CARE MEDICINE   Name: [name] MRN: [id number] DOB: [birth year]    ADMISSION DATE:  06/21/2015 CONSULTATION DATE: 06/21/15  REFERRING MD: EDP  CHIEF COMPLAINT:  Septic shock, UTI  HISTORY OF PRESENT ILLNESS:   82 Y/O with PMH of anemia, GERD, DM. Recent admission from 5/16 to 5/19 with E coli UTI. Now readmitted with septic shock, LA of 8.2, recurrent UTI. She was started on vanco, zosyn and given 1.5 lt bolus. She developed dyspnea, hypoxia hence fluids were stopped. PCCM called to evaluate.  PAST MEDICAL HISTORY :  She  has a past medical history of Anemia; GERD (gastroesophageal reflux disease); Osteoporosis; Diverticulitis; Type II diabetes mellitus (HCC); DVT (deep venous thrombosis) (HCC) (~ 1980); History of blood transfusion (~ 2010; ~ 01/2015); Arthritis; Depression (~ 1960s); and UTI (urinary tract infection) (05/21/2015).  PAST SURGICAL HISTORY: She  has past surgical history that includes Appendectomy; Tonsilectomy, adenoidectomy, bilateral myringotomy and tubes; Cyst excision (Left); Ankle fracture surgery (Left, 1980s); Tonsillectomy (X 2); Fracture surgery; Cholecystectomy open (1990s); and Cataract extraction w/ intraocular lens  implant, bilateral (Bilateral).  Allergies  Allergen Reactions  . Nystatin Swelling    No current facility-administered medications on file prior to encounter.   Current Outpatient Prescriptions on File Prior to Encounter  Medication Sig  . amLODipine (NORVASC) 5 MG tablet Take 5 mg by mouth daily.   Marland Kitchen aspirin 325 MG EC tablet Take 325 mg by mouth daily.  . Chlorpheniramine Maleate (CHLOR-TRIMETON ALLERGY PO) Take 1 tablet by mouth daily.  Marland Kitchen glimepiride (AMARYL) 4 MG tablet Take 4 mg by mouth daily.   . metFORMIN (GLUCOPHAGE-XR) 500 MG 24 hr tablet Take 1,000 mg by mouth 2 (two) times daily at 10 AM and 5 PM.   . pantoprazole (PROTONIX) 40 MG tablet Take 40 mg by mouth daily.  . polysaccharide iron (NIFEREX) 150 MG CAPS  capsule Take 150 mg by mouth 2 (two) times daily.  . Travoprost, BAK Free, (TRAVATAN) 0.004 % SOLN ophthalmic solution Place 1 drop into the left eye at bedtime.   . cefUROXime (CEFTIN) 500 MG tablet Take 1 tablet (500 mg total) by mouth 2 (two) times daily with a meal. (Patient not taking: Reported on 06-21-2015)    FAMILY HISTORY:  Her has no family status information on file.   SOCIAL HISTORY: She  reports that she has never smoked. She has never used smokeless tobacco. She reports that she does not drink alcohol or use illicit drugs.  REVIEW OF SYSTEMS:   C/O fatigue. Rt flank pain Has dyspnea. Denies any fevers, chills, cough, sputum Denies any chest pain, palpitations. All other ROS are negative.     SUBJECTIVE:    VITAL SIGNS: BP 81/46 mmHg  Pulse 96  Temp(Src) 101.1 F (38.4 C) (Rectal)  Resp 38  Ht 5' (1.524 m)  Wt 144 lb (65.318 kg)  BMI 28.12 kg/m2  SpO2 93%  HEMODYNAMICS:    VENTILATOR SETTINGS:    INTAKE / OUTPUT:    PHYSICAL EXAMINATION: General:  Elderly frail women with mild distress Neuro:  Awake, No focal deficits HEENT:  PERRL, No thyromegaly, JVD Cardiovascular:  RRR, no MRG Lungs:  B/L crackles Abdomen:  Soft, +BS Musculoskeletal:  Normal tone or bulk Skin:  Intact  LABS:  BMET  Recent Labs Lab 21-Jun-2015 0852 2015-06-21 0919  NA 134* 136  K 3.3* 3.4*  CL 101 100*  CO2 18*  --   BUN 27* 26*  CREATININE 2.22* 2.00*  GLUCOSE 212* 203*    Electrolytes  Recent Labs Lab October 20, 2015 0852  CALCIUM 9.7    CBC  Recent Labs Lab October 20, 2015 0852 October 20, 2015 0919  WBC 9.4  --   HGB 10.6* 11.2*  HCT 33.7* 33.0*  PLT 198  --     Coag's No results for input(s): APTT, INR in the last 168 hours.  Sepsis Markers  Recent Labs Lab October 20, 2015 0908  LATICACIDVEN 8.52*    ABG No results for input(s): PHART, PCO2ART, PO2ART in the last 168 hours.  Liver Enzymes  Recent Labs Lab October 20, 2015 0852  AST <5*  ALT <5*  ALKPHOS 268*   BILITOT 0.6  ALBUMIN 2.5*    Cardiac Enzymes No results for input(s): TROPONINI, PROBNP in the last 168 hours.  Glucose  Recent Labs Lab October 20, 2015 0843  GLUCAP 216*    Imaging Dg Chest Portable 1 View  07/02/2015  CLINICAL DATA:  Weakness, sepsis, hypotension EXAM: PORTABLE CHEST 1 VIEW COMPARISON:  05/21/2015 FINDINGS: Cardiomegaly. Mild elevation of the right hemidiaphragm with right base atelectasis. No overt edema. No effusions. No confluent opacity on the left. IMPRESSION: Cardiomegaly.  Right base atelectasis. Electronically Signed   By: Charlett NoseKevin  Dover M.D.   On: 09/18/2015 09:42     STUDIES:  CXR 06/30/2015 > Rt base atelectasis  CULTURES: Ucx 6/4 > Bcx 6/4 >  ANTIBIOTICS: Vanco 6/4 > Zosyn 6/4 >  SIGNIFICANT EVENTS: 6/4- Admit with urosepsis.  LINES/TUBES:   DISCUSSION: 82 Y/O with recurrent urosepsis now back with septic shock, elevated LA.  Fluids on hold due to concern about volume overload.  I had a discussion with the patient and her daughter. She is clear that she does not want intubation, resuscitation, central line. We decided that it would be reasonable to bring her to the ICU and try peripheral neo for a brief time to see if the situation will turn around. If no improvement in 12 hrs or in case of deterioration they are ok with stopping all aggressive measures and transitioning to comfort care.   ASSESSMENT / PLAN:  PULMONARY A: Hypoxic resp failure P:   Recheck CXR Supplemental O2  CARDIOVASCULAR A:  Septic shock Elevated LA P:  Stop IVF bolus Continue gentle fluid hydration with NS @75 /hr Follow LA.  RENAL A:   AKI- Prerenal vs ATN P:   Monitior urine output, Cr Check renal ultrasound.  GASTROINTESTINAL A:   Stable P:   Keep NPO for now  HEMATOLOGIC A:   Stable P:   INFECTIOUS A:   UTI P:   Vanco, zosyn Follow cultures  ENDOCRINE A:   DM P:   SSI coverage  NEUROLOGIC A:   Stable P:    FAMILY  - Updates:  Pt and daughter updated at bedside 6/4 - Inter-disciplinary family meet or Palliative Care meeting due by:  6/11.  Critical care time- 35 mins  Chilton GreathousePraveen Michole Lecuyer MD [facility] Pager [phone] If no answer or after 3pm call: [phone] 07/04/2015, 11:22 AM

## 2015-06-09 NOTE — ED Provider Notes (Signed)
CSN: [id number]     Arrival date & time 06/15/2015  4098 History   First MD Initiated Contact with Patient 06/06/2015 [phone]     Chief Complaint  Patient presents with  . Weakness     (Consider location/radiation/quality/duration/timing/severity/associated sxs/prior Treatment) HPI 82 year old female presents by EMS after slipping off of her bed and then being unable to get up. She went to the bathroom and then when she came back she sat on her bed and slipped down to the ground. Had to call staff because she cannot stand up. She states she is just too weak to stand up. Was admitted in the middle of last month for a UTI and sepsis and has been weak ever since. She has had RUQ abdominal pain x 4 months. Placed on colace and has been on for a couple days. She thinks her abd is better since. Chronic dark stool, she thinks its due to iron. EMS noted dark material in oropharynx, though patient denies vomiting or hematemesis. Has urinary frequency but no dysuria. Her mouth feels dry.  Past Medical History  Diagnosis Date  . Anemia   . GERD (gastroesophageal reflux disease)   . Osteoporosis   . Diverticulitis   . Type II diabetes mellitus (HCC)   . DVT (deep venous thrombosis) (HCC) ~ 1980    LLE  . History of blood transfusion ~ 2010; ~ 01/2015    "related to low HgB"  . Arthritis     "right ring finger" (05/21/2015)  . Depression ~ 1960s  . UTI (urinary tract infection) 05/21/2015   Past Surgical History  Procedure Laterality Date  . Appendectomy    . Tonsilectomy, adenoidectomy, bilateral myringotomy and tubes    . Cyst excision Left     "wrist"  . Ankle fracture surgery Left 1980s  . Tonsillectomy  X 2    "tonsils grew back"  . Fracture surgery    . Cholecystectomy open  1990s  . Cataract extraction w/ intraocular lens  implant, bilateral Bilateral    No family history on file. Social History  Substance Use Topics  . Smoking status: Never Smoker   . Smokeless tobacco: Never Used  .  Alcohol Use: No   OB History    No data available     Review of Systems  Constitutional: Negative for fever.  Respiratory: Negative for shortness of breath.   Cardiovascular: Negative for chest pain.  Gastrointestinal: Positive for abdominal pain. Negative for nausea, vomiting, diarrhea and blood in stool.  Genitourinary: Positive for frequency.  Neurological: Positive for weakness. Negative for dizziness, light-headedness and headaches.  All other systems reviewed and are negative.     Allergies  Nystatin  Home Medications   Prior to Admission medications   Medication Sig Start Date End Date Taking? Authorizing Provider  amLODipine (NORVASC) 5 MG tablet Take 1 tablet by mouth daily. 05/17/15   Historical Provider, MD  aspirin 325 MG EC tablet Take 325 mg by mouth daily.    Historical Provider, MD  cefUROXime (CEFTIN) 500 MG tablet Take 1 tablet (500 mg total) by mouth 2 (two) times daily with a meal. 05/24/15   Maryann Mikhail, DO  Chlorpheniramine Maleate (CHLOR-TRIMETON ALLERGY PO) Take 1 tablet by mouth daily.    Historical Provider, MD  glimepiride (AMARYL) 4 MG tablet Take 4 mg by mouth 1 day or 1 dose. 1/2 tablet at noon    Historical Provider, MD  metFORMIN (GLUCOPHAGE-XR) 500 MG 24 hr tablet Take 500 mg by mouth  2 (two) times daily at 10 AM and 5 PM. 2 tablet in AM and 2 tablets PM    Historical Provider, MD  pantoprazole (PROTONIX) 40 MG tablet Take 40 mg by mouth daily.    Historical Provider, MD  polysaccharide iron (NIFEREX) 150 MG CAPS capsule Take 150 mg by mouth 2 (two) times daily.    Historical Provider, MD  Travoprost, BAK Free, (TRAVATAN) 0.004 % SOLN ophthalmic solution 1 drop at bedtime. 1 drop at bedtime in left eye    Historical Provider, MD   BP 80/32 mmHg  Pulse 94  Temp(Src) 98.3 F (36.8 C) (Oral)  Resp 18  SpO2 98% Physical Exam  Constitutional: She is oriented to person, place, and time. She appears well-developed and well-nourished.  HENT:   Head: Normocephalic and atraumatic.  Right Ear: External ear normal.  Left Ear: External ear normal.  Nose: Nose normal.  Mouth/Throat: Mucous membranes are dry.  Eyes: Right eye exhibits no discharge. Left eye exhibits no discharge.  Neck: Neck supple.  Cardiovascular: Normal rate, regular rhythm and normal heart sounds.   Pulmonary/Chest: Effort normal and breath sounds normal.  Abdominal: Soft. She exhibits no distension. There is tenderness in the right upper quadrant.  Genitourinary:  Small amount of dark stool on rectal exam  Neurological: She is alert and oriented to person, place, and time.  Diffusely weak but symmetric in all 4 extremities  Skin: Skin is warm and dry. There is pallor.  Nursing note and vitals reviewed.   ED Course  Procedures (including critical care time) Labs Review Labs Reviewed  COMPREHENSIVE METABOLIC PANEL - Abnormal; Notable for the following:    Sodium 134 (*)    Potassium 3.3 (*)    CO2 18 (*)    Glucose, Bld 212 (*)    BUN 27 (*)    Creatinine, Ser 2.22 (*)    Total Protein 5.4 (*)    Albumin 2.5 (*)    AST <5 (*)    ALT <5 (*)    Alkaline Phosphatase 268 (*)    GFR calc non Af Amer 18 (*)    GFR calc Af Amer 21 (*)    All other components within normal limits  CBC WITH DIFFERENTIAL/PLATELET - Abnormal; Notable for the following:    Hemoglobin 10.6 (*)    HCT 33.7 (*)    MCV 76.8 (*)    MCH 24.1 (*)    nRBC 1 (*)    All other components within normal limits  URINALYSIS, ROUTINE W REFLEX MICROSCOPIC (NOT AT [facility]) - Abnormal; Notable for the following:    Color, Urine AMBER (*)    APPearance CLOUDY (*)    Glucose, UA 500 (*)    Hgb urine dipstick LARGE (*)    Protein, ur 100 (*)    Leukocytes, UA MODERATE (*)    All other components within normal limits  URINE MICROSCOPIC-ADD ON - Abnormal; Notable for the following:    Squamous Epithelial / LPF 0-5 (*)    Bacteria, UA MANY (*)    Casts GRANULAR CAST (*)    All other  components within normal limits  CBG MONITORING, ED - Abnormal; Notable for the following:    Glucose-Capillary 216 (*)    All other components within normal limits  POC OCCULT BLOOD, ED - Abnormal; Notable for the following:    Fecal Occult Bld POSITIVE (*)    All other components within normal limits  I-STAT CG4 LACTIC ACID, ED - Abnormal; Notable  for the following:    Lactic Acid, Venous 8.52 (*)    All other components within normal limits  I-STAT CHEM 8, ED - Abnormal; Notable for the following:    Potassium 3.4 (*)    Chloride 100 (*)    BUN 26 (*)    Creatinine, Ser 2.00 (*)    Glucose, Bld 203 (*)    Calcium, Ion 1.33 (*)    Hemoglobin 11.2 (*)    HCT 33.0 (*)    All other components within normal limits  CULTURE, BLOOD (ROUTINE X 2)  CULTURE, BLOOD (ROUTINE X 2)  URINE CULTURE  LIPASE, BLOOD  TROPONIN I  I-STAT CG4 LACTIC ACID, ED  TYPE AND SCREEN    Imaging Review Dg Chest Portable 1 View  07/01/2015  CLINICAL DATA:  Weakness, sepsis, hypotension EXAM: PORTABLE CHEST 1 VIEW COMPARISON:  05/21/2015 FINDINGS: Cardiomegaly. Mild elevation of the right hemidiaphragm with right base atelectasis. No overt edema. No effusions. No confluent opacity on the left. IMPRESSION: Cardiomegaly.  Right base atelectasis. Electronically Signed   By: Charlett Nose M.D.   On: 06/21/2015 09:42   I have personally reviewed and evaluated these images and lab results as part of my medical decision-making.   EKG Interpretation   Date/Time:  Sunday June 09 2015 08:51:31 EDT Ventricular Rate:  94 PR Interval:  168 QRS Duration: 97 QT Interval:  397 QTC Calculation: 496 R Axis:   95 Text Interpretation:  Sinus rhythm Right axis deviation Borderline low  voltage, extremity leads Borderline prolonged QT interval Confirmed by  Saylee Sherrill MD, Mackey Varricchio [phone]) on 06/21/2015 8:58:44 AM      CRITICAL CARE Performed by: Pricilla Loveless T   Total critical care time: 45 minutes  Critical care time  was exclusive of separately billable procedures and treating other patients.  Critical care was necessary to treat or prevent imminent or life-threatening deterioration.  Critical care was time spent personally by me on the following activities: development of treatment plan with patient and/or surrogate as well as nursing, discussions with consultants, evaluation of patient's response to treatment, examination of patient, obtaining history from patient or surrogate, ordering and performing treatments and interventions, ordering and review of laboratory studies, ordering and review of radiographic studies, pulse oximetry and re-evaluation of patient's condition.  MDM   Final diagnoses:  Septic shock (HCC)  Acute kidney injury [facility])    Patient presents in shock. Found to have rectal temp 101.1. Treated per sepsis protocol. After 1500 out of 2000 mL she became quite dyspneic and now had bibasilar rales. Fluids stopped but still has low BP in 80s. Overall appears worse. However still awake/alert. Discussed with patient and family, she is clear she does not want CPR, intubation or central line. Discussed with ICU, will continue with gentle fluids but add on peripheral vasopressors (phenylephrine). While in ED BP started bottoming out, had to go to high doses of phenylephrine. Patient seems comfortable with fact she will probably die. ICU to admit, transition to comfort care if not improving soon.    Pricilla Loveless, MD 06/21/2015 [phone]

## 2015-06-09 NOTE — ED Notes (Addendum)
Pt becoming less responsive, pressures dropping, O2 sats dropping.  Pressers increased by 50 per Dr Criss AlvineGoldston.  Critical care paged and responded.  Pt placed on NRB and sats increasing to 99%.  Family on bedside.

## 2015-06-09 NOTE — ED Notes (Signed)
US at bedside

## 2015-06-09 NOTE — Progress Notes (Signed)
eLink Physician-Brief Progress Note Patient Name: [name] DOB: 20-Mar-1923 MRN: [id number]   Date of Service  06/15/2015  HPI/Events of Note  Spoke with the patient, [name] Oncale, in the presence of her family. Ms. [name] relates that she desires to pass with comfort and dignity and she wants to be comfort measures and have the oxygen mask and Phenylephrine IV infusion stopped. The family voiced support for her decision and offered no objections.  eICU Interventions  Will make the patient comfort measures and start a Morphine IV infusion and Versed IV PRN.     Intervention Category Minor Interventions: Communication with other healthcare providers and/or family  Lenell AntuSommer,Casmere Hollenbeck Eugene 06/15/2015, 8:44 PM

## 2015-06-11 ENCOUNTER — Telehealth: Payer: Self-pay

## 2015-06-11 LAB — URINE CULTURE

## 2015-06-11 NOTE — Telephone Encounter (Signed)
On 06/11/2015 I received a death certificate from [facility] (original). The death certificate is for burial. The patient is a patient of Doctor Maneem. The death [facility] ([facility] for signature. On 06/12/2015 I received the death certificate back from Doctor Maneem. I got the death certificate ready and called the funeral home to let them know the death certificate is ready for pickup.

## 2015-06-14 LAB — CULTURE, BLOOD (ROUTINE X 2)
CULTURE: NO GROWTH
Culture: NO GROWTH

## 2015-07-06 NOTE — Discharge Summary (Addendum)
Physician Discharge Summary       Patient ID: [name] MRN: [id number] DOB/AGE: Aug 29, 1923 82 y.o.  Admit date: 06/20/2015 Discharge date: 07/31/2015  Discharge Diagnoses:  Hypoxic resp failure Septic shock secondary to UTI  Detailed Hospital Course:  82 Y/O with PMH of anemia, GERD, DM. Recent admission from 5/16 to 5/19 with E coli UTI. Readmitted with septic shock, LA of 8.2, recurrent UTI. She was started on vanco, zosyn and given 1.5 lt bolus. She developed dyspnea, hypoxia hence fluids were stopped due to concern for fluid overload.  PCCM was called to evaluate. I had a discussion with the patient and her daughter. [name] was clear that she did not want intubation, resuscitation, central line. We decided that it would be reasonable to bring her to the ICU and try peripheral neo for a brief time to see if the situation will turn around. However her hemodynamics continued to deteriorate over the day. The patient was alert till the end and decided that she wanted to end all care and transition to comfort care. As per her wishes the pressors were stopped and morphine started for comfort. She passed away peacefully later that night with family at bedside.   Labs at discharge Lab Results  Component Value Date   CREATININE 2.08 (H) May 14, 2015   BUN 28 (H) May 14, 2015   NA 135 May 14, 2015   K 3.3 (L) May 14, 2015   CL 106 May 14, 2015   CO2 19 (L) May 14, 2015   Lab Results  Component Value Date   WBC 14.0 (H) May 14, 2015   HGB 10.4 (L) May 14, 2015   HCT 33.7 (L) May 14, 2015   MCV 76.8 (L) May 14, 2015   PLT 207 May 14, 2015   Lab Results  Component Value Date   ALT 89 (H) May 14, 2015   AST 157 (H) May 14, 2015   ALKPHOS 229 (H) May 14, 2015   BILITOT 1.2 May 14, 2015   No results found for: INR, PROTIME  Current radiology studies No results found.  Disposition:  20-Expired     Medication List    ASK your doctor about these medications   amLODipine 5 MG tablet Commonly  known as:  NORVASC Take 5 mg by mouth daily.   aspirin 325 MG EC tablet Take 325 mg by mouth daily.   cefUROXime 500 MG tablet Commonly known as:  CEFTIN Take 1 tablet (500 mg total) by mouth 2 (two) times daily with a meal.   CHLOR-TRIMETON ALLERGY PO Take 1 tablet by mouth daily.   docusate sodium 100 MG capsule Commonly known as:  COLACE Take 100 mg by mouth daily as needed for mild constipation.   glimepiride 4 MG tablet Commonly known as:  AMARYL Take 4 mg by mouth daily.   metFORMIN 500 MG 24 hr tablet Commonly known as:  GLUCOPHAGE-XR Take 1,000 mg by mouth 2 (two) times daily at 10 AM and 5 PM.   pantoprazole 40 MG tablet Commonly known as:  PROTONIX Take 40 mg by mouth daily.   polysaccharide iron 150 MG capsule Generic drug:  iron polysaccharides Take 150 mg by mouth 2 (two) times daily.   Travoprost (BAK Free) 0.004 % Soln ophthalmic solution Commonly known as:  TRAVATAN Place 1 drop into the left eye at bedtime.       Discharged Condition: Deceased  Chilton GreathousePraveen Lameka Disla MD [facility] Pager [phone] If no answer or after 3pm call: [phone] 07/31/2015, 1:57 PM

## 2015-07-06 NOTE — Progress Notes (Signed)
Nursing Note: Pupils fixed with absent respirations and heart beat for greater than 2 minutes as observed by 2 RN's at bedside (Arihant Pennings A Nuria Phebus and [employer] [place]).  Morphine infusion stopped and son at bedside notified of time of death as [id number].  E-Link and CCM were notified.  CDS declined donation with reference number given [id number].  Mortuary information obtained.  [name] - Son at bedside. 4 rings (1 gold ring, 1 bridal set, 1 December birthstone, and 1 mother's ring) along with 1 watch and 1 emergency response locater were removed and given to son, [name].  200mg  IV morphine wasted and witnessed in sink..Marland Kitchen

## 2015-07-06 DEATH — deceased

## 2017-02-13 IMAGING — CR DG CHEST 1V PORT
1 series · 1 of 1 positions shown · non-contrast
Comparison: 05/21/2015

CLINICAL DATA: Weakness, sepsis, hypotension

EXAM:
PORTABLE CHEST 1 VIEW

[AP]
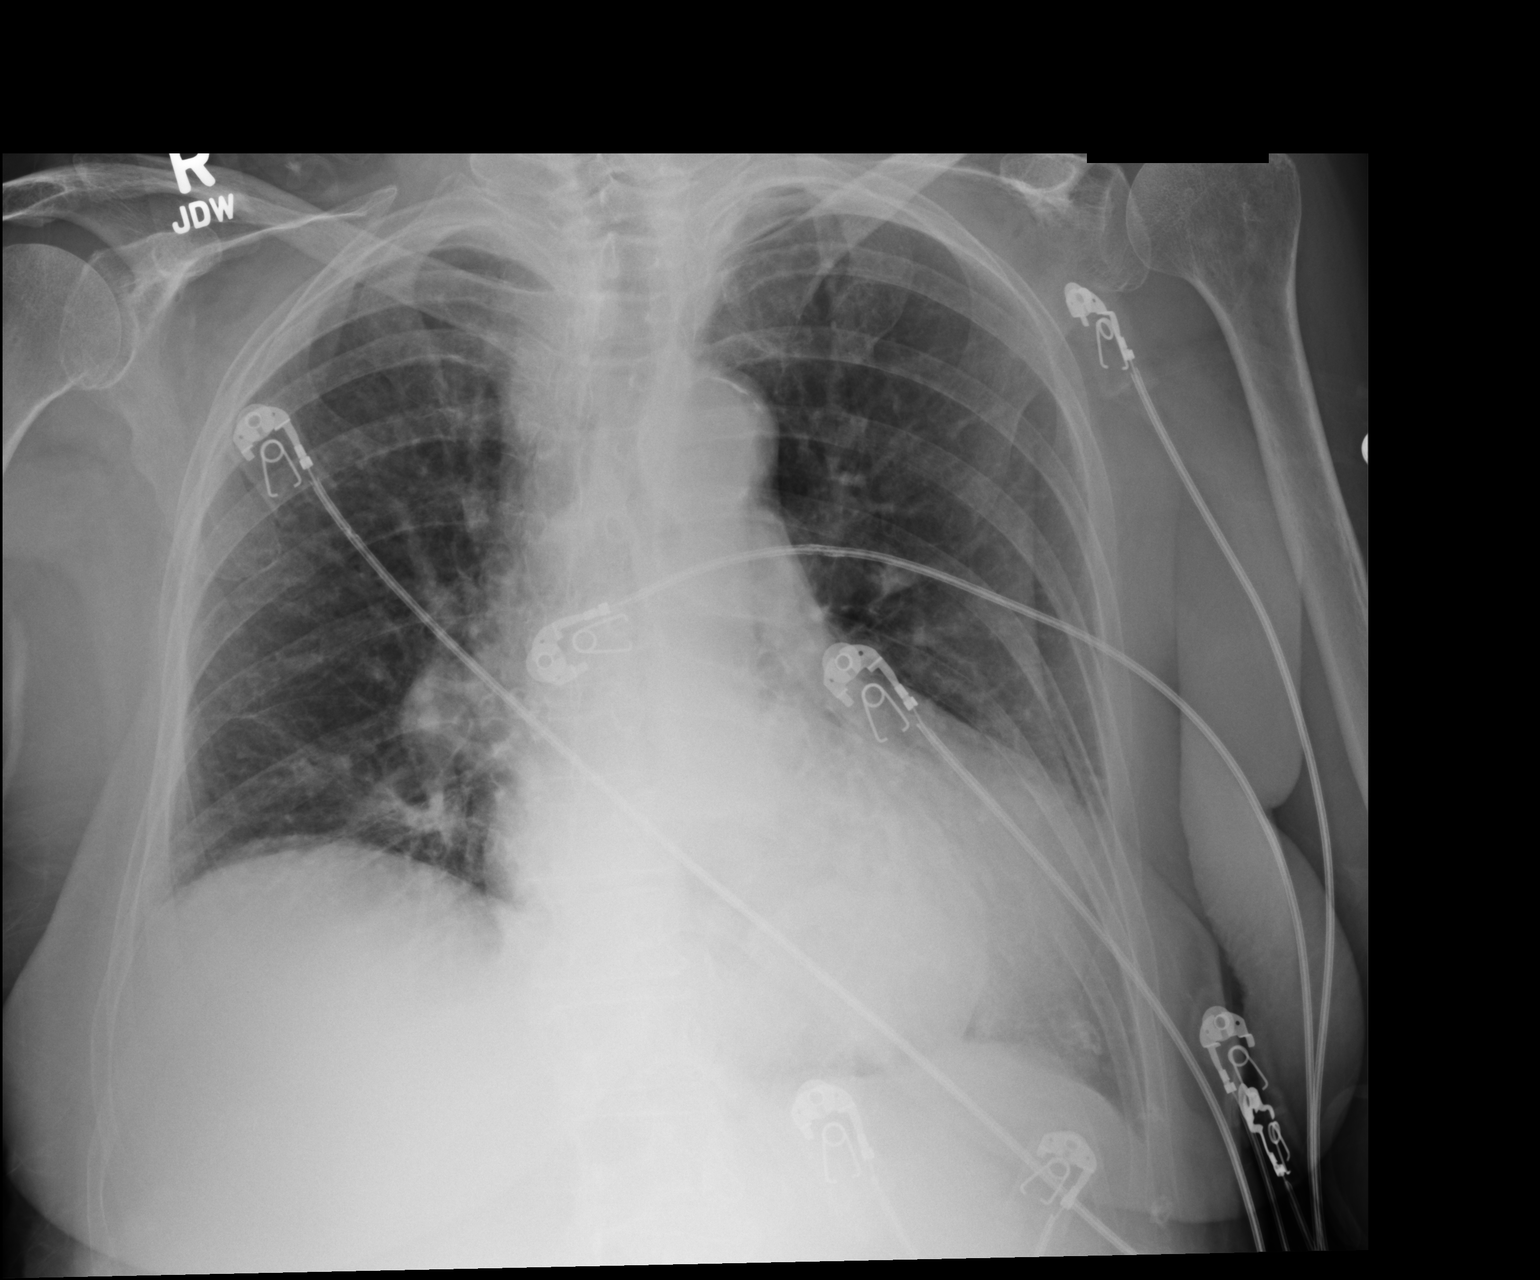

[1 of 1 positions shown; findings below may reference images not displayed]

FINDINGS: Cardiomegaly. Mild elevation of the right hemidiaphragm with right
base atelectasis. No overt edema. No effusions. No confluent opacity
on the left.
IMPRESSION: Cardiomegaly.  Right base atelectasis.

## 2017-02-13 IMAGING — CR DG CHEST 1V PORT
1 series · 1 of 1 positions shown · non-contrast
Comparison: 06/09/2015

CLINICAL DATA: Increase shortness of breath. Possible fluid
overload.

EXAM:
PORTABLE CHEST 1 VIEW

[AP]
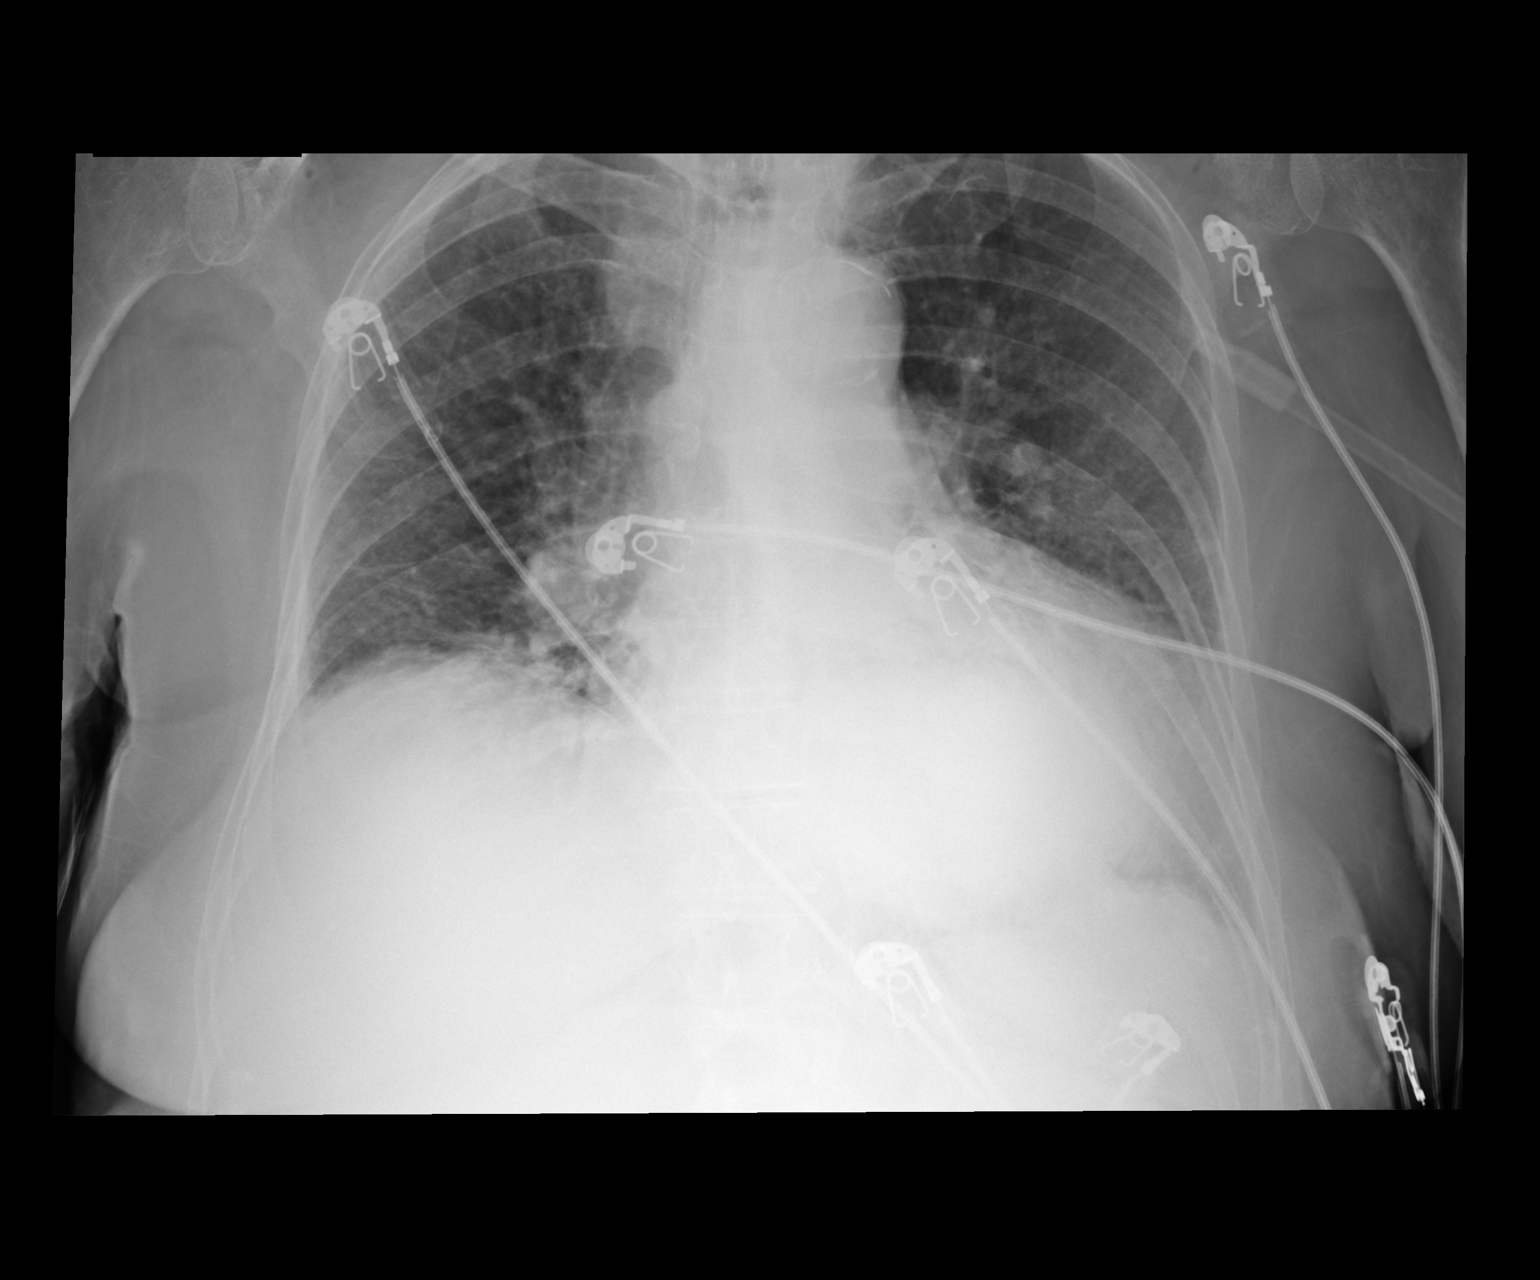

[1 of 1 positions shown; findings below may reference images not displayed]

FINDINGS: Cardiomegaly. Left retrocardiac opacity likely reflects hernia. Mild
increase in right base atelectasis. No overt edema. No effusions or
acute bony abnormality.
IMPRESSION: Mild increase in right base atelectasis with stable elevation of the
right hemidiaphragm.

Cardiomegaly.

Left retrocardiac opacity felt represent hiatal hernia.

## 2017-06-05 IMAGING — US US RENAL
1 series · 14 of 25 positions shown · non-contrast
Comparison: 06/09/2015

CLINICAL DATA: Acute renal failure, right hydronephrosis

EXAM:
RENAL / URINARY TRACT ULTRASOUND COMPLETE

[Series 1: us renal · 0.25mm/px · 14 of 30 slices shown]
[im 1/30]
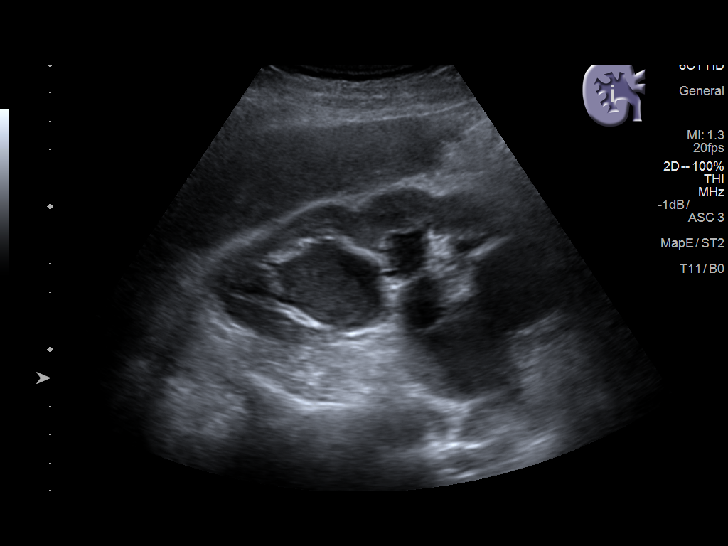
[im 3/30]
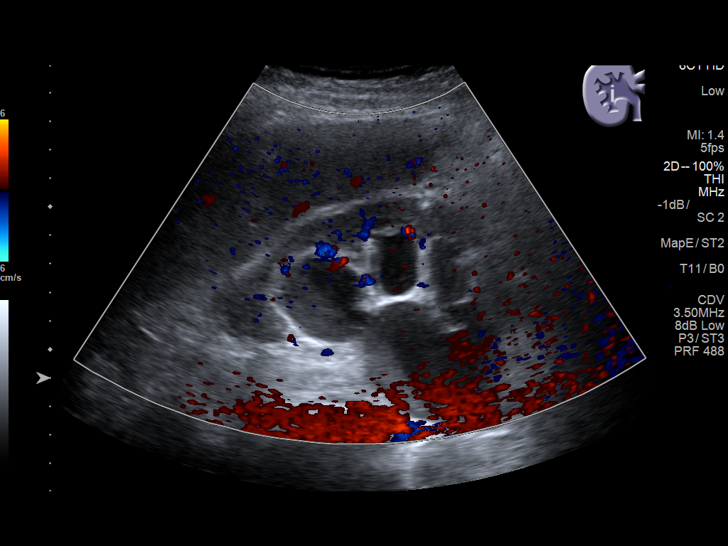
[im 5/30]
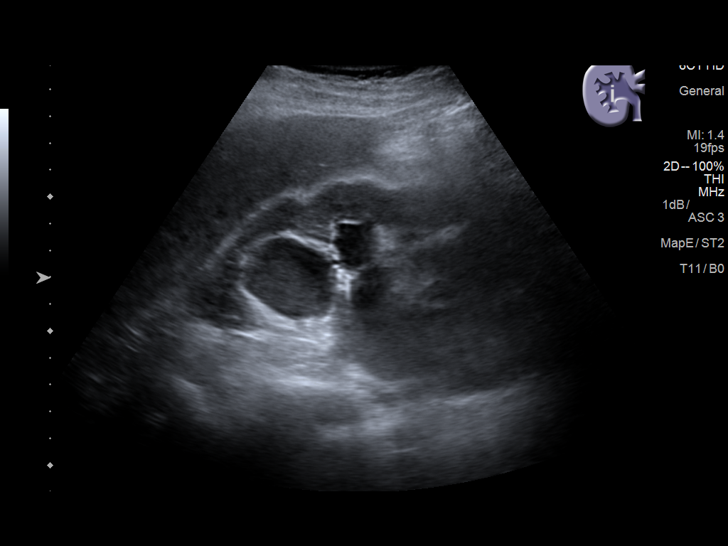
[im 8/30]
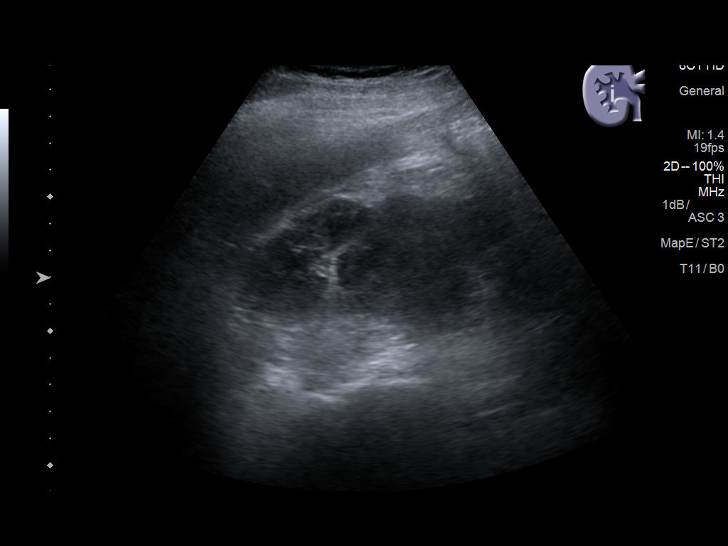
[im 10/30]
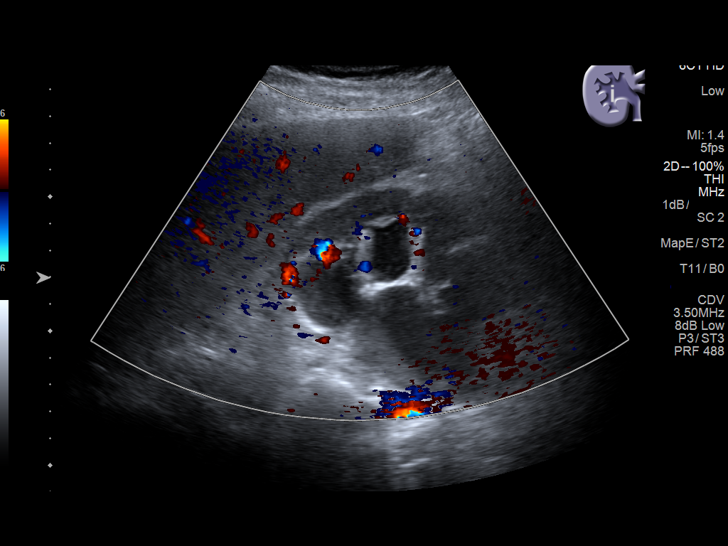
[im 11/30]
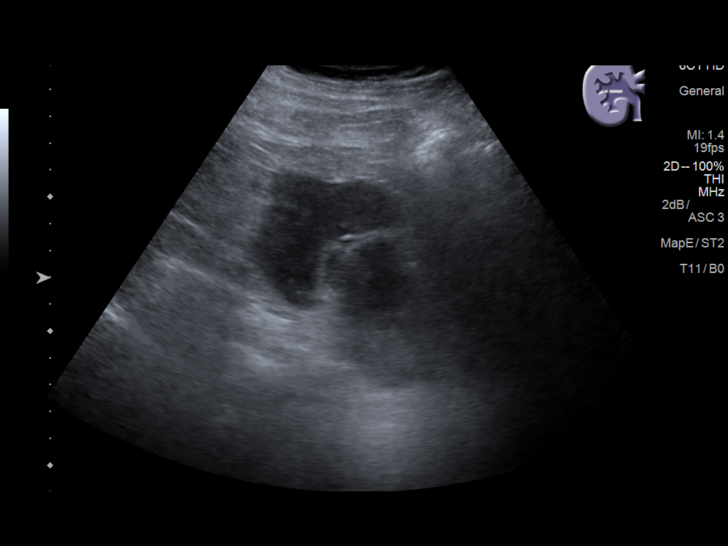
[im 14/30]
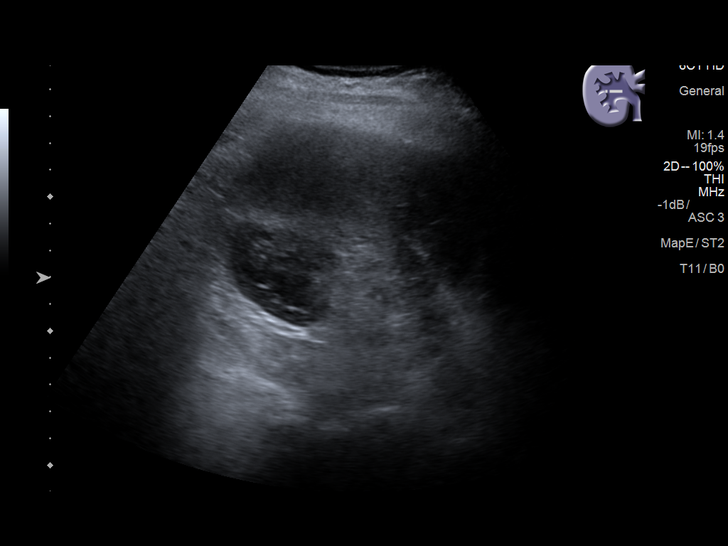
[im 16/30]
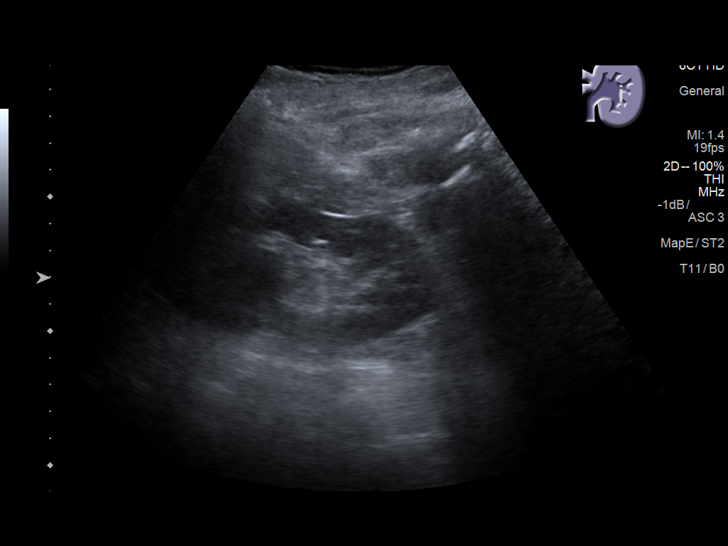
[im 19/30]
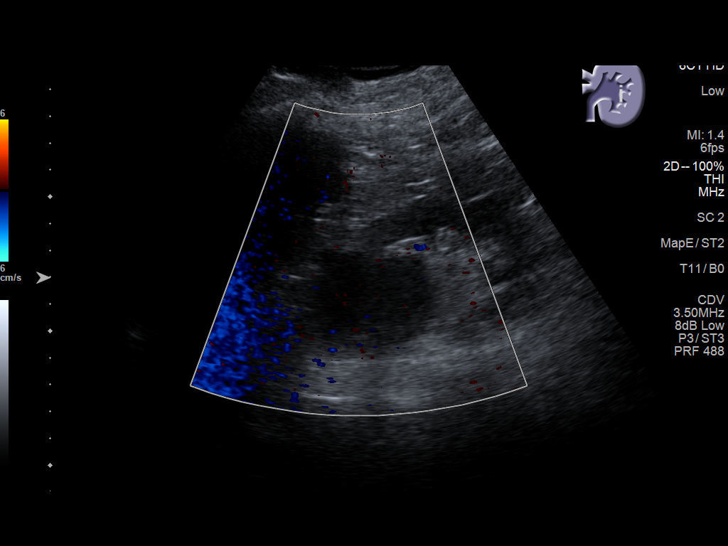
[im 20/30]
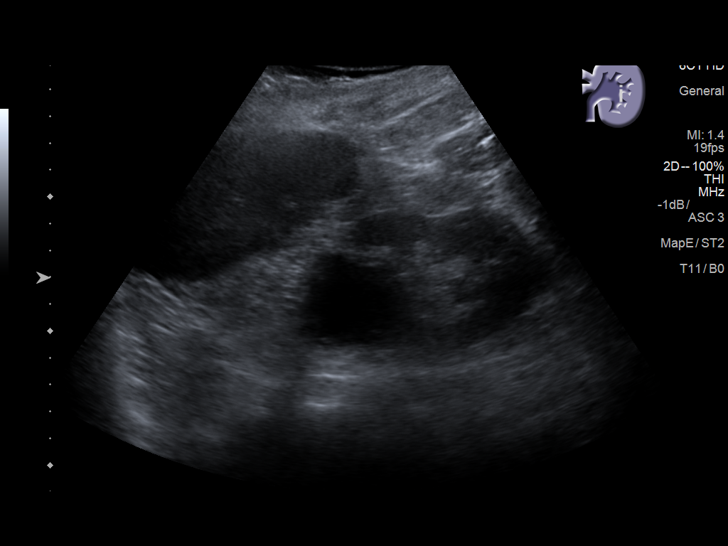
[im 22/30]
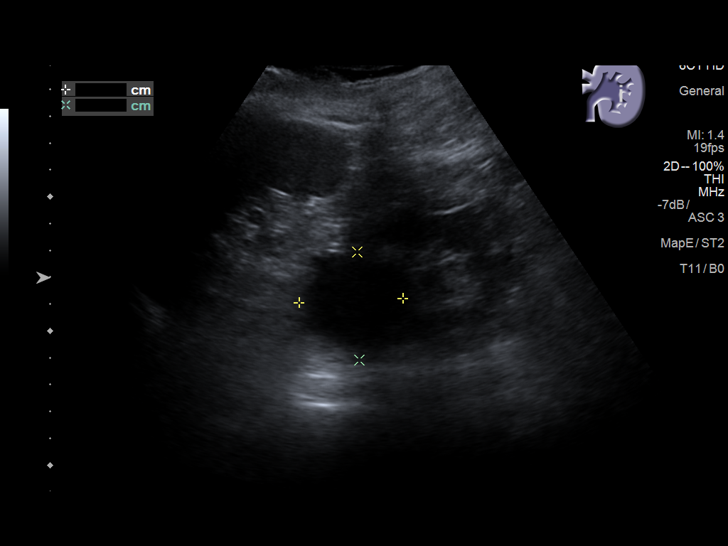
[im 25/30]
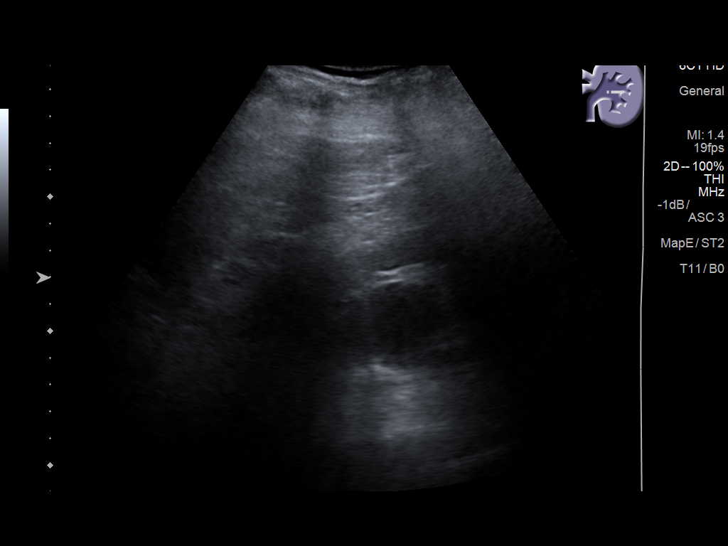
[im 27/30]
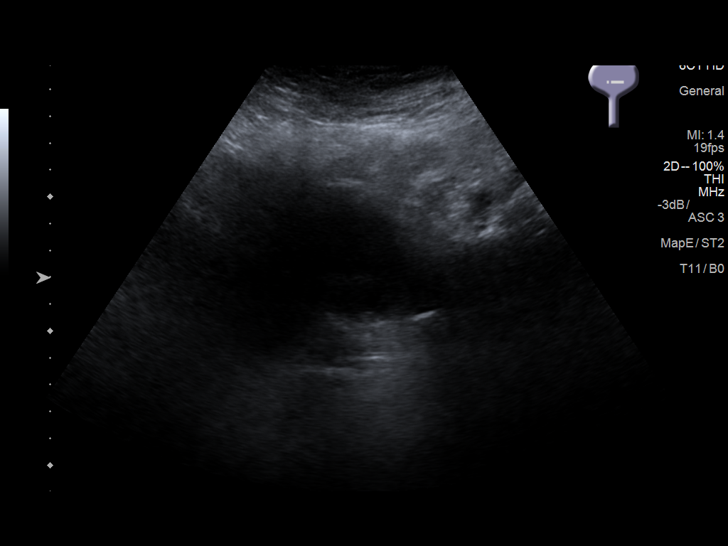
[im 30/30]
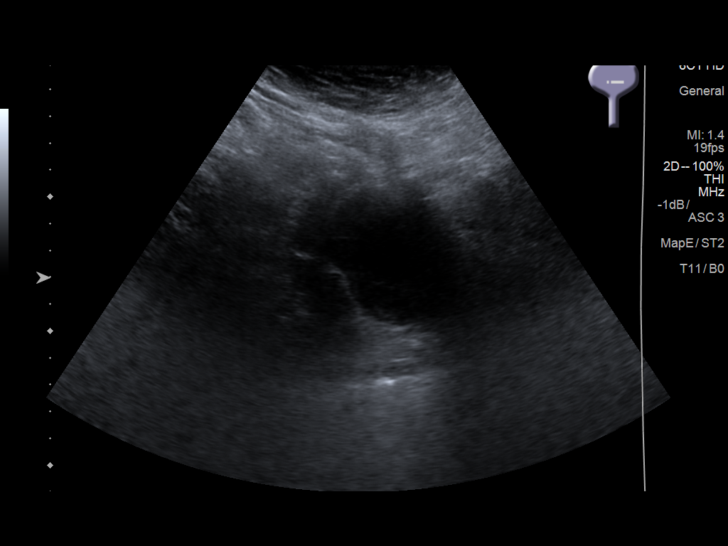

[14 of 25 positions shown; findings below may reference images not displayed]

FINDINGS: Limited exam because the patient condition.

Right Kidney:

Length: 13.2 cm. Severe right hydronephrosis again evident, similar
to the CT exam.

Left Kidney:

Length: 11.6 cm. Upper pole 4 cm hypoechoic cyst present. No left
hydronephrosis.

Bladder:

Limited assessment of the bladder.  No gross abnormality.
IMPRESSION: Severe right hydronephrosis.

4 cm left renal cyst.
